# Patient Record
Sex: Female | Born: 1965 | Race: White | Hispanic: No | Marital: Married | Smoking: Never smoker
Health system: Southern US, Community
[De-identification: ages and names within clinical notes are randomized; demographics above are authoritative.]

## PROBLEM LIST (undated history)

## (undated) DIAGNOSIS — J45909 Unspecified asthma, uncomplicated: Secondary | ICD-10-CM

## (undated) DIAGNOSIS — R519 Headache, unspecified: Secondary | ICD-10-CM

## (undated) DIAGNOSIS — R51 Headache: Secondary | ICD-10-CM

## (undated) DIAGNOSIS — R32 Unspecified urinary incontinence: Secondary | ICD-10-CM

## (undated) DIAGNOSIS — T4145XA Adverse effect of unspecified anesthetic, initial encounter: Secondary | ICD-10-CM

## (undated) DIAGNOSIS — F419 Anxiety disorder, unspecified: Secondary | ICD-10-CM

## (undated) DIAGNOSIS — T8859XA Other complications of anesthesia, initial encounter: Secondary | ICD-10-CM

## (undated) HISTORY — PX: LAPAROSCOPIC GASTRIC BANDING: SHX1100

## (undated) HISTORY — DX: Unspecified asthma, uncomplicated: J45.909

## (undated) HISTORY — PX: OTHER SURGICAL HISTORY: SHX169

## (undated) HISTORY — PX: FOOT SURGERY: SHX648

## (undated) HISTORY — PX: LAPAROSCOPIC GASTRIC SLEEVE RESECTION: SHX5895

## (undated) HISTORY — PX: VAGINAL HYSTERECTOMY: SUR661

## (undated) HISTORY — PX: BLADDER SURGERY: SHX569

---

## 2004-08-03 ENCOUNTER — Ambulatory Visit: Payer: Self-pay

## 2004-08-14 ENCOUNTER — Ambulatory Visit: Payer: Self-pay

## 2005-02-12 ENCOUNTER — Ambulatory Visit: Payer: Self-pay

## 2005-08-23 ENCOUNTER — Ambulatory Visit: Payer: Self-pay

## 2006-01-27 ENCOUNTER — Emergency Department: Payer: Self-pay | Admitting: Emergency Medicine

## 2006-03-08 ENCOUNTER — Ambulatory Visit: Payer: Self-pay | Admitting: Unknown Physician Specialty

## 2006-08-29 ENCOUNTER — Ambulatory Visit: Payer: Self-pay

## 2007-07-18 ENCOUNTER — Ambulatory Visit: Payer: Self-pay | Admitting: Nurse Practitioner

## 2007-08-21 ENCOUNTER — Ambulatory Visit: Payer: Self-pay | Admitting: Nurse Practitioner

## 2007-08-25 ENCOUNTER — Ambulatory Visit: Payer: Self-pay | Admitting: Nurse Practitioner

## 2007-08-27 ENCOUNTER — Emergency Department: Payer: Self-pay | Admitting: Unknown Physician Specialty

## 2007-09-02 ENCOUNTER — Ambulatory Visit: Payer: Self-pay | Admitting: Nurse Practitioner

## 2007-09-09 ENCOUNTER — Ambulatory Visit: Payer: Self-pay

## 2008-05-04 ENCOUNTER — Ambulatory Visit: Payer: Self-pay

## 2008-10-14 ENCOUNTER — Emergency Department (HOSPITAL_COMMUNITY): Admission: EM | Admit: 2008-10-14 | Discharge: 2008-10-14 | Payer: Self-pay | Admitting: Emergency Medicine

## 2008-10-26 ENCOUNTER — Ambulatory Visit: Payer: Self-pay | Admitting: Unknown Physician Specialty

## 2008-12-27 ENCOUNTER — Ambulatory Visit: Payer: Self-pay | Admitting: Unknown Physician Specialty

## 2009-03-03 ENCOUNTER — Ambulatory Visit: Payer: Self-pay

## 2009-05-17 ENCOUNTER — Ambulatory Visit: Payer: Self-pay | Admitting: Unknown Physician Specialty

## 2009-10-11 ENCOUNTER — Emergency Department: Payer: Self-pay | Admitting: Emergency Medicine

## 2009-11-07 ENCOUNTER — Ambulatory Visit: Payer: Self-pay | Admitting: Nurse Practitioner

## 2009-11-08 ENCOUNTER — Ambulatory Visit: Payer: Self-pay | Admitting: Family Medicine

## 2009-11-09 ENCOUNTER — Encounter: Admission: RE | Admit: 2009-11-09 | Discharge: 2009-11-09 | Payer: Self-pay | Admitting: Surgery

## 2009-11-11 ENCOUNTER — Encounter: Admission: RE | Admit: 2009-11-11 | Discharge: 2009-11-11 | Payer: Self-pay | Admitting: Surgery

## 2010-04-06 ENCOUNTER — Emergency Department: Payer: Self-pay | Admitting: Emergency Medicine

## 2010-04-06 ENCOUNTER — Ambulatory Visit: Payer: Self-pay | Admitting: Unknown Physician Specialty

## 2010-04-10 ENCOUNTER — Emergency Department: Payer: Self-pay | Admitting: Emergency Medicine

## 2010-04-11 ENCOUNTER — Emergency Department: Payer: Self-pay | Admitting: Emergency Medicine

## 2010-04-19 ENCOUNTER — Ambulatory Visit: Payer: Self-pay | Admitting: Family Medicine

## 2010-05-08 ENCOUNTER — Ambulatory Visit: Payer: Self-pay | Admitting: Nurse Practitioner

## 2010-08-22 ENCOUNTER — Ambulatory Visit: Payer: Self-pay | Admitting: Internal Medicine

## 2010-09-19 ENCOUNTER — Ambulatory Visit: Payer: Self-pay | Admitting: Internal Medicine

## 2010-09-27 LAB — COMPREHENSIVE METABOLIC PANEL
ALT: 13 U/L (ref 0–35)
BUN: 7 mg/dL (ref 6–23)
CO2: 28 mEq/L (ref 19–32)
GFR calc non Af Amer: >60 mL/min (ref >60)
Glucose, Bld: 100 mg/dL — ABNORMAL HIGH (ref 70–99)
Potassium: 3.7 mEq/L (ref 3.5–5.1)
Sodium: 139 mEq/L (ref 135–145)
Total Bilirubin: 0.7 mg/dL (ref 0.3–1.2)
Total Protein: 6.4 g/dL (ref 6.0–8.3)

## 2010-09-27 LAB — DIFFERENTIAL
Basophils Absolute: 0 10*3/uL (ref 0.0–0.1)
Basophils Relative: 1 % (ref 0–1)
Eosinophils Absolute: 0.3 10*3/uL (ref 0.0–0.7)
Eosinophils Relative: 5 % (ref 0–5)
Lymphocytes Relative: 26 % (ref 12–46)
Lymphs Abs: 1.7 10*3/uL (ref 0.7–4.0)
Monocytes Absolute: 0.5 10*3/uL (ref 0.1–1.0)
Neutro Abs: 4 10*3/uL (ref 1.7–7.7)
Neutrophils Relative %: 61 % (ref 43–77)

## 2010-09-27 LAB — CBC
MCV: 90.4 fL (ref 78.0–100.0)
RBC: 4.35 MIL/uL (ref 3.87–5.11)
WBC: 6.6 10*3/uL (ref 4.0–10.5)

## 2010-09-27 LAB — URINALYSIS, ROUTINE W REFLEX MICROSCOPIC
Glucose, UA: NEGATIVE mg/dL
Nitrite: NEGATIVE
Urobilinogen, UA: 1 mg/dL (ref 0.0–1.0)

## 2010-09-27 LAB — URINE MICROSCOPIC-ADD ON

## 2010-09-27 LAB — URINE CULTURE

## 2010-10-31 NOTE — Op Note (Signed)
NAMELINLEY, MOSKAL NO.:  [id number]   MEDICAL RECORD NO.:  [id number]          PATIENT TYPE:  EMS   LOCATION:  ED                           FACILITY:  [facility]   PHYSICIAN:  Adolph Pollack, M.D.DATE OF BIRTH:  [birth year]   DATE OF PROCEDURE:  10/14/2008  DATE OF DISCHARGE:                               OPERATIVE REPORT   PREOPERATIVE DIAGNOSIS:  Epigastric pain and regurgitation in a  laparoscopic band patient.   POSTOPERATIVE DIAGNOSIS:  Epigastric pain and regurgitation in a  laparoscopic band patient.   PROCEDURE:  Laparoscopic band adjustment with withdrawal of 5 mL of  fluid until the band was dry.   SURGEON:  Adolph Pollack, M.D.   TECHNIQUE:  The skin over the port site was prepped with Betadine.  Using a long Huber needle, I was able to place the needle into the port.  I aspirated 5 mL of clear fluid out until it was dry.  I then removed  the needle and discarded the fluid.  A sterile dressing was applied.  She tolerated the procedure well.      Adolph Pollack, M.D.  Electronically Signed     TJR/MEDQ  D:  10/14/2008  T:  10/14/2008  Job:  [id number]

## 2010-10-31 NOTE — Consult Note (Signed)
Erika Shelton, Erika Shelton NO.:  [id number]   MEDICAL RECORD NO.:  [id number]          PATIENT TYPE:  EMS   LOCATION:  ED                           FACILITY:  [facility]   PHYSICIAN:  Adolph Pollack, M.D.DATE OF BIRTH:  [birth year]   DATE OF CONSULTATION:  10/14/2008  DATE OF DISCHARGE:                                 CONSULTATION   REASON FOR CONSULTATION:  Epigastric pain and last laparoscopic band  placement.   HISTORY:  Ms. Erika Shelton is a 44 year old female who underwent a laparoscopic  band procedure for morbid obesity in [place], [place]  approximately 1 year ago.  She has been having some problems with what  she describes as a feeling of anything sheets getting stuck, some  regurgitation, some epigastric pain and some back pain.  She had been  seen in our office for care of her band.  She had some fluid removed by  Dr. Daphine Deutscher about 4 weeks ago but says persistence of her symptoms.  A  primary care physician has put her on a proton pump inhibitor but this  has not worked.  Because the symptoms are persisting she has come to the  emergency department for evaluation.  There has been no noted fever or  chills.  She has had some constipation and decreased urination urinary  frequency.   PHYSICAL EXAMINATION:  CONSTITUTIONAL:  Generally she is an overweight  female who is in no acute distress, pleasant and cooperative.  VITAL SIGNS:  Temperature is 98 degrees, blood pressure is 126/82, pulse  of 82.  ABDOMEN:  Her abdomen is soft.  There is very minimal epigastric  tenderness.  There are active bowel sounds present.  There are small  abdominal incisions and also palpable port in the left abdominal wall.   LABORATORY DATA:  White cell count is normal 6600, hemoglobin 13.4.  Electrolytes within normal limits except for glucose of 100.  Liver  function tests are not elevated and lipase and amylase are normal.  Urinalysis demonstrates many squamous epithelial cells,  7-10 white blood  cells, and many bacteria consistent with contamination.   Abdominal x-rays demonstrated nonobstructive gas pattern, no free air,  the band is at 8:30 to 2:30 position.   IMPRESSION:  1. Proximal gastric obstruction secondary to lap band.  2. Mild dehydration.  3. Contaminated urinalysis specimen.   PLAN:  1. We will remove fluid completely from the lap band (this was done      sterilely and 5 mL was withdrawn).  2. Will hydrate her with 2 liters of lactated Ringer's.  3. Will get a clean catch urinalysis culture.  4. Will have recall the office tomorrow between 9 and 9:30 to let us      know how she is doing.  If she is not doing any better, she may      need to be seen in the office for further workup up.      Adolph Pollack, M.D.  Electronically Signed     TJR/MEDQ  D:  10/14/2008  T:  10/14/2008  Job:  [id number] 

## 2011-04-11 ENCOUNTER — Ambulatory Visit: Payer: Self-pay | Admitting: Unknown Physician Specialty

## 2011-04-16 ENCOUNTER — Ambulatory Visit: Payer: Self-pay | Admitting: Urology

## 2011-05-17 ENCOUNTER — Emergency Department: Payer: Self-pay | Admitting: *Deleted

## 2011-05-31 ENCOUNTER — Ambulatory Visit: Payer: Self-pay | Admitting: Specialist

## 2011-06-19 ENCOUNTER — Ambulatory Visit: Payer: Self-pay | Admitting: Specialist

## 2011-07-04 ENCOUNTER — Ambulatory Visit: Payer: Self-pay | Admitting: Unknown Physician Specialty

## 2012-01-28 ENCOUNTER — Ambulatory Visit: Payer: Self-pay | Admitting: Unknown Physician Specialty

## 2012-08-26 ENCOUNTER — Ambulatory Visit: Payer: Self-pay | Admitting: Urology

## 2013-01-29 ENCOUNTER — Ambulatory Visit: Payer: Self-pay | Admitting: Family

## 2013-02-02 ENCOUNTER — Ambulatory Visit: Payer: Self-pay | Admitting: Family

## 2013-02-18 ENCOUNTER — Ambulatory Visit: Payer: Self-pay | Admitting: Family

## 2013-04-02 ENCOUNTER — Other Ambulatory Visit: Payer: Self-pay | Admitting: Podiatry

## 2013-04-02 MED ORDER — CEPHALEXIN 500 MG PO CAPS: 500.0000 mg | ORAL_CAPSULE | Freq: Three times a day (TID) | ORAL | Status: DC

## 2013-04-02 MED ORDER — OXYCODONE-ACETAMINOPHEN 10-325 MG PO TABS: ORAL_TABLET | ORAL | Status: DC

## 2013-04-02 MED ORDER — PROMETHAZINE HCL 25 MG PO TABS: 25.0000 mg | ORAL_TABLET | Freq: Three times a day (TID) | ORAL | Status: DC | PRN

## 2013-04-03 ENCOUNTER — Encounter: Payer: Self-pay | Admitting: Podiatry

## 2013-04-03 DIAGNOSIS — M21619 Bunion of unspecified foot: Secondary | ICD-10-CM

## 2013-04-03 DIAGNOSIS — M2040 Other hammer toe(s) (acquired), unspecified foot: Secondary | ICD-10-CM

## 2013-04-06 ENCOUNTER — Telehealth: Payer: Self-pay | Admitting: *Deleted

## 2013-04-06 MED ORDER — MEPERIDINE HCL 50 MG PO TABS: ORAL_TABLET | ORAL | Status: DC

## 2013-04-06 NOTE — Telephone Encounter (Signed)
I have changed to Demerol.  Please have someone to come in and pick it up.

## 2013-04-06 NOTE — Telephone Encounter (Signed)
Dr Al Corpus did surgery on Friday , he prescribed hydrocodone for me , pt is itching real bad with the hydrocodone, she took a benadry with it and still itching, is there another pain medication that she can have?

## 2013-04-08 ENCOUNTER — Ambulatory Visit (INDEPENDENT_AMBULATORY_CARE_PROVIDER_SITE_OTHER): Payer: 59 | Admitting: Podiatry

## 2013-04-08 ENCOUNTER — Ambulatory Visit (INDEPENDENT_AMBULATORY_CARE_PROVIDER_SITE_OTHER): Payer: 59

## 2013-04-08 ENCOUNTER — Encounter: Payer: Self-pay | Admitting: Podiatry

## 2013-04-08 VITALS — BP 123/78 | HR 97 | Resp 16 | Ht 63.0 in | Wt 206.0 lb

## 2013-04-08 DIAGNOSIS — M2042 Other hammer toe(s) (acquired), left foot: Secondary | ICD-10-CM

## 2013-04-08 DIAGNOSIS — M2040 Other hammer toe(s) (acquired), unspecified foot: Secondary | ICD-10-CM

## 2013-04-08 DIAGNOSIS — M2041 Other hammer toe(s) (acquired), right foot: Secondary | ICD-10-CM

## 2013-04-08 NOTE — Patient Instructions (Signed)
REMEMBER TO KEEP YOUR FEET ELEVATED AND STAY OFF OF THEM.

## 2013-04-08 NOTE — Progress Notes (Signed)
Ms. Willa Rough presents today just short of 1 week status post hammertoe refit pair fifth bilateral and fifth metatarsal osteotomy bilateral. She states they've been quite sore she has been working been up on her feet. Denies fever chills nausea vomiting muscle aches or pains. States that the oxycodone that I gave her made her itch. Did switch that to Demerol and she just picking a prescription of today.  Objective: Vital signs are stable she is alert and oriented x3. Pulses remain palpable bilateral. Dry sterile dressing was removed demonstrates sutures are intact margins are well coapted. Considerable amount of edema with no erythema. Graphic evaluation does demonstrate fifth metatarsal osteotomies and arthroplasties fifth toes bilateral.  Assessment: Well-healing surgical feet. Fifth metatarsal and hammertoe repairs bilateral.  Plan: Redressed today with a dressed a compressive dressing encouraged her to stay off of her feet. She's to keep her feet elevated as often as possible. Wrote her note to be out of work. Followup with her in one week hopefully for suture removal.

## 2013-04-16 ENCOUNTER — Ambulatory Visit: Payer: 59 | Admitting: Podiatry

## 2013-04-16 ENCOUNTER — Encounter: Payer: Self-pay | Admitting: Podiatry

## 2013-04-16 ENCOUNTER — Ambulatory Visit (INDEPENDENT_AMBULATORY_CARE_PROVIDER_SITE_OTHER): Payer: 59 | Admitting: Podiatry

## 2013-04-16 VITALS — BP 115/68 | HR 87 | Temp 96.5°F | Resp 16 | Ht 63.0 in | Wt 206.0 lb

## 2013-04-16 DIAGNOSIS — B351 Tinea unguium: Secondary | ICD-10-CM

## 2013-04-16 DIAGNOSIS — Z9889 Other specified postprocedural states: Secondary | ICD-10-CM

## 2013-04-16 NOTE — Progress Notes (Signed)
Erika Shelton presents today for her two-week postop visit regarding her fifth metatarsal osteotomies fifth bilateral. She states her hammertoes are doing pretty well.  Objective: Vital signs are stable she is alert and oriented x3. There is mild edema no erythema cellulitis drainage or odor to surgical sites. Margins appear to be well coapted sutures are in place.  Assessment: Well-healing surgical foot bilateral.  Plan: Sutures were removed today compression dressings were applied. She was me to allow her to get out of her Darco shoes. I declined. She's continue to wear the Darco shoes for at least another 2 weeks and radiographs will be taken at that point in time.

## 2013-04-29 ENCOUNTER — Ambulatory Visit (INDEPENDENT_AMBULATORY_CARE_PROVIDER_SITE_OTHER): Payer: 59

## 2013-04-29 ENCOUNTER — Encounter: Payer: Self-pay | Admitting: Podiatry

## 2013-04-29 ENCOUNTER — Ambulatory Visit (INDEPENDENT_AMBULATORY_CARE_PROVIDER_SITE_OTHER): Payer: 59 | Admitting: Podiatry

## 2013-04-29 VITALS — BP 113/71 | HR 70 | Resp 16 | Ht 63.0 in | Wt 206.0 lb

## 2013-04-29 DIAGNOSIS — Z9889 Other specified postprocedural states: Secondary | ICD-10-CM

## 2013-04-29 NOTE — Progress Notes (Signed)
Ms. Erika Shelton presents today date of surgery 04/03/2013 for followup of a fifth metatarsal osteotomy bilateral fifth hammertoe repair she states that they're doing quite well still sore swollen tender to put in shoes.  Objective evaluation: Pulses are palpable bilateral she has no tenderness on palpation of the fifth metatarsal and fifth toes bilateral. Mild edema is noted bilateral fifth toes. Radiographic evaluation demonstrates well-healing surgical foot bilateral.  Assessment: Well-healing surgical foot one month out fifth metatarsal bilateral. Hammertoes fifth bilateral.  Plan: We'll or tobacco during her shoe gear and we did to continue to wrap the fifth toes with Caban. Ultram one month for another set of x-rays.

## 2013-05-27 ENCOUNTER — Encounter: Payer: 59 | Admitting: Podiatry

## 2013-06-03 ENCOUNTER — Encounter: Payer: Self-pay | Admitting: Podiatry

## 2013-06-24 ENCOUNTER — Ambulatory Visit (INDEPENDENT_AMBULATORY_CARE_PROVIDER_SITE_OTHER): Payer: 59 | Admitting: Podiatry

## 2013-06-24 ENCOUNTER — Ambulatory Visit (INDEPENDENT_AMBULATORY_CARE_PROVIDER_SITE_OTHER): Payer: 59

## 2013-06-24 ENCOUNTER — Encounter: Payer: Self-pay | Admitting: Podiatry

## 2013-06-24 VITALS — BP 132/76 | HR 78 | Resp 16

## 2013-06-24 DIAGNOSIS — Z9889 Other specified postprocedural states: Secondary | ICD-10-CM

## 2013-06-24 NOTE — Progress Notes (Signed)
   Subjective:    Patient ID: Erika Shelton, female    DOB: [birth year], 48 y.o.   MRN: [id number]  HPI Comments: Post op 10.17.14  Follow up fifth met ost bilateral , sometimes in the right foot i feel burning sensation  Left foot is good       Review of Systems     Objective:   Physical Exam: Vital signs are stable she is alert and oriented x3. Pulses are strongly palpable bilateral. Fifth metatarsal osteotomies have gone to heal 100% per radiograph. She has no pain on palpation or ambulation. Radiographic evaluation does demonstrate complete osseus healing about fifth metatarsals with retention of the fifth metatarsal screws.        Assessment & Plan:  Assessment: Some residual plantar fasciitis right. Well-healing surgical fifth metatarsals and toes.  Plan: I will followup with her on an as-needed basis.

## 2013-06-26 ENCOUNTER — Ambulatory Visit: Payer: Self-pay | Admitting: Obstetrics and Gynecology

## 2013-06-29 NOTE — Progress Notes (Signed)
1. 5TH METATARSAL OSTEOTOMY WITH SCREWS BOTH FEET  2. HAMMER TOE REPAIR 5TH TOE BOTH FEET

## 2013-12-03 ENCOUNTER — Ambulatory Visit: Payer: 59 | Admitting: Podiatry

## 2013-12-23 ENCOUNTER — Encounter: Payer: Self-pay | Admitting: Podiatry

## 2013-12-23 ENCOUNTER — Ambulatory Visit (INDEPENDENT_AMBULATORY_CARE_PROVIDER_SITE_OTHER): Payer: 59 | Admitting: Podiatry

## 2013-12-23 VITALS — BP 104/52 | HR 98 | Resp 16

## 2013-12-23 DIAGNOSIS — M722 Plantar fascial fibromatosis: Secondary | ICD-10-CM

## 2013-12-23 NOTE — Progress Notes (Signed)
She presents today for chief complaint of painful plantar fasciitis right heel.  Objective: Pulses are palpable right foot. She's been on palpation medial continued tubercle of the right heel.  Assessment: Pain in limb secondary to plantar fasciitis right foot.  Plan: Reinjected the right heel today with Kenalog and local anesthetic. She will followup with [place] as needed.

## 2013-12-28 ENCOUNTER — Other Ambulatory Visit: Payer: Self-pay | Admitting: *Deleted

## 2013-12-28 ENCOUNTER — Telehealth: Payer: Self-pay | Admitting: *Deleted

## 2013-12-28 MED ORDER — MELOXICAM 15 MG PO TABS: 15.0000 mg | ORAL_TABLET | Freq: Every day | ORAL | Status: DC

## 2013-12-28 NOTE — Telephone Encounter (Signed)
Pt called stating that she had an injection in her heel a week or so ago and still having some soreness. Asked if she should be taking a anti inflammatory? Per dr Milinda Pointer to call in mobic

## 2014-01-05 ENCOUNTER — Emergency Department: Payer: Self-pay | Admitting: Internal Medicine

## 2014-01-05 LAB — BASIC METABOLIC PANEL
Anion Gap: 5 — ABNORMAL LOW (ref 7–16)
BUN: 11 mg/dL (ref 7–18)
Calcium, Total: 8.7 mg/dL (ref 8.5–10.1)
Chloride: 105 mmol/L (ref 98–107)
Co2: 29 mmol/L (ref 21–32)
Creatinine: 0.85 mg/dL (ref 0.60–1.30)
EGFR (African American): >60
EGFR (Non-African Amer.): >60
Glucose: 85 mg/dL (ref 65–99)
Osmolality: 276 (ref 275–301)
Potassium: 3.8 mmol/L (ref 3.5–5.1)
Sodium: 139 mmol/L (ref 136–145)

## 2014-01-05 LAB — CBC
HCT: 40.2 % (ref 35.0–47.0)
HGB: 13 g/dL (ref 12.0–16.0)
MCH: 28.4 pg (ref 26.0–34.0)
MCHC: 32.3 g/dL (ref 32.0–36.0)
MCV: 88 fL (ref 80–100)
Platelet: 281 10*3/uL (ref 150–440)
RBC: 4.57 10*6/uL (ref 3.80–5.20)
RDW: 13.7 % (ref 11.5–14.5)
WBC: 6.9 10*3/uL (ref 3.6–11.0)

## 2014-01-05 LAB — TROPONIN I: Troponin-I: <0.02 ng/mL

## 2014-01-05 LAB — TSH: Thyroid Stimulating Horm: 3.06 u[IU]/mL

## 2014-01-05 LAB — PRO B NATRIURETIC PEPTIDE: B-Type Natriuretic Peptide: 43 pg/mL (ref 0–125)

## 2014-01-05 LAB — D-DIMER(ARMC): D-Dimer: 319 ng/ml

## 2014-01-22 DIAGNOSIS — R42 Dizziness and giddiness: Secondary | ICD-10-CM | POA: Insufficient documentation

## 2014-01-22 DIAGNOSIS — R0602 Shortness of breath: Secondary | ICD-10-CM | POA: Insufficient documentation

## 2014-01-22 DIAGNOSIS — R000 Tachycardia, unspecified: Secondary | ICD-10-CM | POA: Insufficient documentation

## 2014-01-22 DIAGNOSIS — R079 Chest pain, unspecified: Secondary | ICD-10-CM | POA: Insufficient documentation

## 2014-06-28 ENCOUNTER — Encounter: Payer: Self-pay | Admitting: Podiatry

## 2014-06-29 ENCOUNTER — Ambulatory Visit: Payer: Self-pay | Admitting: Obstetrics and Gynecology

## 2014-07-01 ENCOUNTER — Ambulatory Visit: Payer: Self-pay | Admitting: Obstetrics and Gynecology

## 2014-07-27 ENCOUNTER — Ambulatory Visit: Payer: Self-pay | Admitting: Specialist

## 2014-07-29 ENCOUNTER — Ambulatory Visit: Payer: Self-pay | Admitting: Specialist

## 2014-07-29 DIAGNOSIS — G4733 Obstructive sleep apnea (adult) (pediatric): Secondary | ICD-10-CM | POA: Insufficient documentation

## 2014-07-29 DIAGNOSIS — E782 Mixed hyperlipidemia: Secondary | ICD-10-CM | POA: Insufficient documentation

## 2014-08-17 ENCOUNTER — Ambulatory Visit
Admit: 2014-08-17 | Disposition: A | Discharge status: Home / Self Care | Payer: Self-pay | Attending: Specialist | Admitting: Specialist

## 2014-09-17 ENCOUNTER — Ambulatory Visit
Admit: 2014-09-17 | Disposition: A | Discharge status: Home / Self Care | Payer: Self-pay | Attending: Specialist | Admitting: Specialist

## 2014-09-29 ENCOUNTER — Ambulatory Visit
Admit: 2014-09-29 | Disposition: A | Discharge status: Home / Self Care | Payer: Self-pay | Attending: General Practice | Admitting: General Practice

## 2014-10-06 ENCOUNTER — Ambulatory Visit
Admit: 2014-10-06 | Disposition: A | Discharge status: Home / Self Care | Payer: Self-pay | Attending: Pain Medicine | Admitting: Pain Medicine

## 2014-10-08 NOTE — Op Note (Signed)
PATIENT NAME:  Erika Shelton, Erika Shelton MR#:  [id number] DATE OF BIRTH:  [birth year]  DATE OF PROCEDURE:  08/26/2012  PREOPERATIVE DIAGNOSES:  1.  Urinary frequency.  2.  Urge incontinence.   POSTOPERATIVE DIAGNOSES:  1.  Urinary frequency.  2.  Urge incontinence.   PROCEDURE: InterStim placement.   SURGEON: Edrick Oh, MD.   ANESTHESIA: General endotracheal anesthesia.   INDICATIONS: The patient is a 49 year old white female with a long history of urinary frequency, urgency and urge incontinence. She has been tried on medical management without success. She underwent recent InterStim trial with significant benefit. She presents for InterStim placement.   DESCRIPTION OF PROCEDURE: After informed consent was obtained, the patient was taken to the operating room and placed in the prone position on the operating table under general endotracheal anesthesia. The patient was then prepped and draped in the usual standard fashion. A needle was placed after marking the skin of appropriate areas for the third sacral foramen. The needle was advanced into a foramen without difficulty. On fluoroscopy, it appeared to be the S4 foramen. No response was obtained. The needle was repositioned more superior. It was easily advanced into the third sacral foramen. Stimulation demonstrated good toe response and bellows with no leg rotation. Due to the good response, the decision was made not to proceed with any additional needle placement on the left. The obturator was placed through the needle. The needle was then removed. A small skin incision was made. The sheath was advanced over the obturator. The sheath was advanced to just below the lower level of the sacrum. The obturator of the sheath was then removed. A lead was advanced through the sheath. The sheath was withdrawn to the first position. Testing demonstrated good response on 0 and 1 with minimal response on 2. The lead was slightly repositioned with gentle manipulation.  This demonstrated good response on 1 and 2 with mild response on 0 and slight response on 3. The sheath was then completely withdrawn. Testing of the lead once again demonstrated the same response. A subcutaneous pocket was then made in the upper outer buttock on the right. Once the pocket was developed, it was irrigated with ample amounts of antibiotic irrigating solution. The subcutaneous pass was then placed from the lead site to the pocket site. The lead was then tunneled underneath the skin. The generator was connected to the lead. It was placed into the subcutaneous pocket. An impedance test was performed demonstrating good parameters throughout. The pocket was then closed in 2 layers utilizing a 3-0 Vicryl suture. The skin was then closed utilizing a running 4-0 Vicryl subcuticular stitch. The lead site was also closed utilizing a 4-0 Vicryl subcuticular stitch. Steri-Strips, Telfa and Tegaderm dressing was then applied. The patient was returned to the supine position and awakened from general endotracheal anesthesia. She was taken to the recovery room in stable condition. There were no problems or complications. The patient tolerated the procedure well.  ____________________________ Denice Bors. Jacqlyn Larsen, MD bsc:aw D: 08/27/2012 08:00:10 ET T: 08/27/2012 08:26:36 ET JOB#: [id number]  cc: Denice Bors. Jacqlyn Larsen, MD, <Dictator> Denice Bors Ajanay Farve MD ELECTRONICALLY SIGNED 09/01/2012 12:06

## 2014-10-26 ENCOUNTER — Ambulatory Visit: Payer: Self-pay | Admitting: Pain Medicine

## 2014-11-21 ENCOUNTER — Other Ambulatory Visit: Payer: Self-pay | Admitting: Pain Medicine

## 2014-11-21 DIAGNOSIS — S93699A Other sprain of unspecified foot, initial encounter: Secondary | ICD-10-CM | POA: Insufficient documentation

## 2014-11-21 DIAGNOSIS — G90521 Complex regional pain syndrome I of right lower limb: Secondary | ICD-10-CM

## 2014-11-21 DIAGNOSIS — G90529 Complex regional pain syndrome I of unspecified lower limb: Secondary | ICD-10-CM | POA: Insufficient documentation

## 2014-11-23 ENCOUNTER — Encounter: Payer: Self-pay | Admitting: Pain Medicine

## 2014-11-23 ENCOUNTER — Ambulatory Visit: Payer: 59 | Attending: Pain Medicine | Admitting: Pain Medicine

## 2014-11-23 VITALS — BP 143/92 | HR 74 | Temp 98.0°F | Resp 16 | Ht 63.0 in | Wt 215.0 lb

## 2014-11-23 DIAGNOSIS — M47816 Spondylosis without myelopathy or radiculopathy, lumbar region: Secondary | ICD-10-CM | POA: Insufficient documentation

## 2014-11-23 DIAGNOSIS — M5126 Other intervertebral disc displacement, lumbar region: Secondary | ICD-10-CM | POA: Diagnosis not present

## 2014-11-23 DIAGNOSIS — M79604 Pain in right leg: Secondary | ICD-10-CM | POA: Diagnosis present

## 2014-11-23 DIAGNOSIS — M533 Sacrococcygeal disorders, not elsewhere classified: Secondary | ICD-10-CM | POA: Insufficient documentation

## 2014-11-23 DIAGNOSIS — M5136 Other intervertebral disc degeneration, lumbar region: Secondary | ICD-10-CM

## 2014-11-23 DIAGNOSIS — M5416 Radiculopathy, lumbar region: Secondary | ICD-10-CM

## 2014-11-23 DIAGNOSIS — M79605 Pain in left leg: Secondary | ICD-10-CM | POA: Diagnosis present

## 2014-11-23 DIAGNOSIS — M51369 Other intervertebral disc degeneration, lumbar region without mention of lumbar back pain or lower extremity pain: Secondary | ICD-10-CM | POA: Insufficient documentation

## 2014-11-23 DIAGNOSIS — M545 Low back pain: Secondary | ICD-10-CM | POA: Diagnosis present

## 2014-11-23 MED ORDER — MIDAZOLAM HCL 5 MG/5ML IJ SOLN
INTRAMUSCULAR | Status: AC
  Administered 2014-11-23: 3 mg via INTRAVENOUS
  Filled 2014-11-23: qty 5

## 2014-11-23 MED ORDER — LIDOCAINE HCL (PF) 1 % IJ SOLN
INTRAMUSCULAR | Status: AC
Start: 2014-11-23 — End: 2014-11-23
  Administered 2014-11-23: 5 mL
  Filled 2014-11-23: qty 5

## 2014-11-23 MED ORDER — SODIUM CHLORIDE 0.9 % IJ SOLN
INTRAMUSCULAR | Status: AC
  Administered 2014-11-23: 14:00:00
  Filled 2014-11-23: qty 20

## 2014-11-23 MED ORDER — BUPIVACAINE HCL (PF) 0.25 % IJ SOLN
INTRAMUSCULAR | Status: AC
  Administered 2014-11-23: 15 mL
  Filled 2014-11-23: qty 30

## 2014-11-23 MED ORDER — CEFUROXIME AXETIL 250 MG PO TABS: 250.0000 mg | ORAL_TABLET | Freq: Two times a day (BID) | ORAL | Status: DC

## 2014-11-23 MED ORDER — FENTANYL CITRATE (PF) 100 MCG/2ML IJ SOLN
INTRAMUSCULAR | Status: AC
  Administered 2014-11-23: 50 ug via INTRAVENOUS
  Filled 2014-11-23: qty 2

## 2014-11-23 MED ORDER — ORPHENADRINE CITRATE 30 MG/ML IJ SOLN
INTRAMUSCULAR | Status: AC
  Filled 2014-11-23: qty 2

## 2014-11-23 MED ORDER — TRIAMCINOLONE ACETONIDE 40 MG/ML IJ SUSP
INTRAMUSCULAR | Status: AC
  Administered 2014-11-23: 40 mg
  Filled 2014-11-23: qty 1

## 2014-11-23 MED ORDER — ZOLPIDEM TARTRATE 10 MG PO TABS: 10.0000 mg | ORAL_TABLET | Freq: Every evening | ORAL | Status: DC | PRN

## 2014-11-23 MED ORDER — KETOROLAC TROMETHAMINE 60 MG/2ML IM SOLN
INTRAMUSCULAR | Status: AC
  Administered 2014-11-23: 60 mg via INTRAMUSCULAR
  Filled 2014-11-23: qty 2

## 2014-11-23 MED ORDER — BUPIVACAINE HCL (PF) 0.25 % IJ SOLN
INTRAMUSCULAR | Status: AC
  Filled 2014-11-23: qty 30

## 2014-11-23 MED ORDER — CEFAZOLIN SODIUM 1 G IJ SOLR
INTRAMUSCULAR | Status: AC
  Administered 2014-11-23: 1 g via INTRAVENOUS
  Filled 2014-11-23: qty 10

## 2014-11-23 MED ORDER — FLUCONAZOLE 150 MG PO TABS: ORAL_TABLET | ORAL | Status: DC

## 2014-11-23 NOTE — Progress Notes (Signed)
Safety precautions to be maintained throughout the outpatient stay will include: orient to surroundings, keep bed in low position, maintain call bell within reach at all times, provide assistance with transfer out of bed and ambulation. Discharged to home with mother in law with script in hand for diflucan, ceftin, ambien. Post procedure instructions given wioth teach back 3 done.

## 2014-11-23 NOTE — Progress Notes (Signed)
   Subjective:    Patient ID: Juanita Craver, female    DOB: [birth year], 49 y.o.   MRN: [id number]  HPI  PROCEDURE PERFORMED: Lumbar epidural steroid injection   NOTE: The patient is a 49 y.o. female who returns to [facility] for further evaluation and treatment of pain involving the lumbar and lower extremity region. MRI revealed the patient to be with L2 bulge greater on the left without nerve root compression L3-4 bulge, L4-L5 facet degenerative changes, L4-5 facet joint degeneration with bulge slightly greater to the left L5-S1 facet joint degenerative changes with bulge with contact upper S1 nerve roots, sacroiliac joint degenerative changes. The risks, benefits, and expectations of the procedure have been discussed and explained to the patient who was understanding and in agreement with suggested treatment plan. We will proceed with interventional treatment as discussed and explained to the patient who is willing to proceed with procedure as planned.   DESCRIPTION OF PROCEDURE: Lumbar epidural steroid injection with IV Versed, IV fentanyl conscious sedation, EKG, blood pressure, pulse, and pulse oximetry monitoring. The procedure was performed with the patient in the prone position under fluoroscopic guidance. A local anesthetic skin wheal of 1.5% plain lidocaine was accomplished at proposed entry site. An 18-gauge Tuohy epidural needle was inserted at the L 4 vertebral body level right of the midline via loss-of-resistance technique with negative heme and negative CSF return. A total of 4 mL of Preservative-Free normal saline with 40 mg of Kenalog injected incrementally via epidurally placed needle. Needle removed. The patient tolerated the injection well.   Myoneural block injections cervical region A total of 5 cc of quarter percent bupivacaine with tramadol 60 mg was injected following negative aspiration for myoneural block injection of the cervical region. Patient tolerated  procedure well  Myoneural block injection of the gluteal region  A total of 10 cc of quarter percent bupivacaine with Norflex was injected in the gluteal musculature region following negative aspiration for myoneural block injection of the gluteal musculature region performed 2. Patient tolerated procedure well    PLAN:   1. Medications: We will continue presently prescribed medications. 2. Will consider modification of treatment regimen pending response to treatment rendered on today's visit and follow-up evaluation. 3. The patient is to follow-up with primary care physician regarding blood pressure and general medical condition status post lumbar epidural steroid injection performed on today's visit. 4. Surgical evaluation. 5. Neurological evaluation. 6. The patient may be a candidate for radiofrequency procedures, implantation device, and other treatment pending response to treatment and follow-up evaluation. 7. The patient has been advised to adhere to proper body mechanics and avoid activities which appear to aggravate condition. 8. The patient has been advised to call the [facility] prior to scheduled return appointment should there be significant change in condition or should there be significant  Review of Systems     Objective:   Physical Exam        Assessment & Plan:

## 2014-11-23 NOTE — Patient Instructions (Addendum)
Continue present medications and antibiotic Ceftin, Diflucan, and Ambien  F/U PCP for evaliation of  BP and general medical  condition.  F/U surgical evaluation.  F/U nrurological evaluation.  May consider radiofrequency rhizolysis or intraspinal procedures pending response to present treatment and F/U evaluation.  Patient to call [facility] should patient have concerns prior to scheduled return appointment.  Pain Management Discharge Instructions  General Discharge Instructions :  If you need to reach your doctor call: Monday-Friday 8:00 am - 4:00 pm at [phone] or toll free [phone].  After clinic hours [phone] to have operator reach doctor.  Bring all of your medication bottles to all your appointments in the pain clinic.  To cancel or reschedule your appointment with Pain Management please remember to call 24 hours in advance to avoid a fee.  Refer to the educational materials which you have been given on: General Risks, I had my Procedure. Discharge Instructions, Post Sedation.  Post Procedure Instructions:  The drugs you were given will stay in your system until tomorrow, so for the next 24 hours you should not drive, make any legal decisions or drink any alcoholic beverages.  You may eat anything you prefer, but it is better to start with liquids then soups and crackers, and gradually work up to solid foods.  Please notify your doctor immediately if you have any unusual bleeding, trouble breathing or pain that is not related to your normal pain.  Depending on the type of procedure that was done, some parts of your body may feel week and/or numb.  This usually clears up by tonight or the next day.  Walk with the use of an assistive device or accompanied by an adult for the 24 hours.  You may use ice on the affected area for the first 24 hours.  Put ice in a Ziploc bag and cover with a towel and place against area 15 minutes on 15 minutes off.  You  may switch to heat after 24 hours.GENERAL RISKS AND COMPLICATIONS  What are the risk, side effects and possible complications? Generally speaking, most procedures are safe.  However, with any procedure there are risks, side effects, and the possibility of complications.  The risks and complications are dependent upon the sites that are lesioned, or the type of nerve block to be performed.  The closer the procedure is to the spine, the more serious the risks are.  Great care is taken when placing the radio frequency needles, block needles or lesioning probes, but sometimes complications can occur. 1. Infection: Any time there is an injection through the skin, there is a risk of infection.  This is why sterile conditions are used for these blocks.  There are four possible types of infection. 1. Localized skin infection. 2. Central Nervous System Infection-This can be in the form of Meningitis, which can be deadly. 3. Epidural Infections-This can be in the form of an epidural abscess, which can cause pressure inside of the spine, causing compression of the spinal cord with subsequent paralysis. This would require an emergency surgery to decompress, and there are no guarantees that the patient would recover from the paralysis. 4. Discitis-This is an infection of the intervertebral discs.  It occurs in about 1% of discography procedures.  It is difficult to treat and it may lead to surgery.        2. Pain: the needles have to go through skin and soft tissues, will cause soreness.       3.  Damage to internal structures:  The nerves to be lesioned may be near blood vessels or    other nerves which can be potentially damaged.       4. Bleeding: Bleeding is more common if the patient is taking blood thinners such as  aspirin, Coumadin, Ticiid, Plavix, etc., or if he/she have some genetic predisposition  such as hemophilia. Bleeding into the spinal canal can cause compression of the spinal  cord with subsequent  paralysis.  This would require an emergency surgery to  decompress and there are no guarantees that the patient would recover from the  paralysis.       5. Pneumothorax:  Puncturing of a lung is a possibility, every time a needle is introduced in  the area of the chest or upper back.  Pneumothorax refers to free air around the  collapsed lung(s), inside of the thoracic cavity (chest cavity).  Another two possible  complications related to a similar event would include: Hemothorax and Chylothorax.   These are variations of the Pneumothorax, where instead of air around the collapsed  lung(s), you may have blood or chyle, respectively.       6. Spinal headaches: They may occur with any procedures in the area of the spine.       7. Persistent CSF (Cerebro-Spinal Fluid) leakage: This is a rare problem, but may occur  with prolonged intrathecal or epidural catheters either due to the formation of a fistulous  track or a dural tear.       8. Nerve damage: By working so close to the spinal cord, there is always a possibility of  nerve damage, which could be as serious as a permanent spinal cord injury with  paralysis.       9. Death:  Although rare, severe deadly allergic reactions known as "Anaphylactic  reaction" can occur to any of the medications used.      10. Worsening of the symptoms:  We can always make thing worse.  What are the chances of something like this happening? Chances of any of this occuring are extremely low.  By statistics, you have more of a chance of getting killed in a motor vehicle accident: while driving to the hospital than any of the above occurring .  Nevertheless, you should be aware that they are possibilities.  In general, it is similar to taking a shower.  Everybody knows that you can slip, hit your head and get killed.  Does that mean that you should not shower again?  Nevertheless always keep in mind that statistics do not mean anything if you happen to be on the wrong side of  them.  Even if a procedure has a 1 (one) in a 1,000,000 (million) chance of going wrong, it you happen to be that one..Also, keep in mind that by statistics, you have more of a chance of having something go wrong when taking medications.  Who should not have this procedure? If you are on a blood thinning medication (e.g. Coumadin, Plavix, see list of "Blood Thinners"), or if you have an active infection going on, you should not have the procedure.  If you are taking any blood thinners, please inform your physician.  How should I prepare for this procedure?  Do not eat or drink anything at least six hours prior to the procedure.  Bring a driver with you .  It cannot be a taxi.  Come accompanied by an adult that can drive you back, and that  is strong enough to help you if your legs get weak or numb from the local anesthetic.  Take all of your medicines the morning of the procedure with just enough water to swallow them.  If you have diabetes, make sure that you are scheduled to have your procedure done first thing in the morning, whenever possible.  If you have diabetes, take only half of your insulin dose and notify our nurse that you have done so as soon as you arrive at the clinic.  If you are diabetic, but only take blood sugar pills (oral hypoglycemic), then do not take them on the morning of your procedure.  You may take them after you have had the procedure.  Do not take aspirin or any aspirin-containing medications, at least eleven (11) days prior to the procedure.  They may prolong bleeding.  Wear loose fitting clothing that may be easy to take off and that you would not mind if it got stained with Betadine or blood.  Do not wear any jewelry or perfume  Remove any nail coloring.  It will interfere with some of our monitoring equipment.  NOTE: Remember that this is not meant to be interpreted as a complete list of all possible complications.  Unforeseen problems may occur.  BLOOD  THINNERS The following drugs contain aspirin or other products, which can cause increased bleeding during surgery and should not be taken for 2 weeks prior to and 1 week after surgery.  If you should need take something for relief of minor pain, you may take acetaminophen which is found in Tylenol,m Datril, Anacin-3 and Panadol. It is not blood thinner. The products listed below are.  Do not take any of the products listed below in addition to any listed on your instruction sheet.  A.P.C or A.P.C with Codeine Codeine Phosphate Capsules #3 Ibuprofen Ridaura  ABC compound Congesprin Imuran rimadil  Advil Cope Indocin Robaxisal  Alka-Seltzer Effervescent Pain Reliever and Antacid Coricidin or Coricidin-D  Indomethacin Rufen  Alka-Seltzer plus Cold Medicine Cosprin Ketoprofen S-A-C Tablets  Anacin Analgesic Tablets or Capsules Coumadin Korlgesic Salflex  Anacin Extra Strength Analgesic tablets or capsules CP-2 Tablets Lanoril Salicylate  Anaprox Cuprimine Capsules Levenox Salocol  Anexsia-D Dalteparin Magan Salsalate  Anodynos Darvon compound Magnesium Salicylate Sine-off  Ansaid Dasin Capsules Magsal Sodium Salicylate  Anturane Depen Capsules Marnal Soma  APF Arthritis pain formula Dewitt's Pills Measurin Stanback  Argesic Dia-Gesic Meclofenamic Sulfinpyrazone  Arthritis Bayer Timed Release Aspirin Diclofenac Meclomen Sulindac  Arthritis pain formula Anacin Dicumarol Medipren Supac  Analgesic (Safety coated) Arthralgen Diffunasal Mefanamic Suprofen  Arthritis Strength Bufferin Dihydrocodeine Mepro Compound Suprol  Arthropan liquid Dopirydamole Methcarbomol with Aspirin Synalgos  ASA tablets/Enseals Disalcid Micrainin Tagament  Ascriptin Doan's Midol Talwin  Ascriptin A/D Dolene Mobidin Tanderil  Ascriptin Extra Strength Dolobid Moblgesic Ticlid  Ascriptin with Codeine Doloprin or Doloprin with Codeine Momentum Tolectin  Asperbuf Duoprin Mono-gesic Trendar  Aspergum Duradyne Motrin or Motrin  IB Triminicin  Aspirin plain, buffered or enteric coated Durasal Myochrisine Trigesic  Aspirin Suppositories Easprin Nalfon Trillsate  Aspirin with Codeine Ecotrin Regular or Extra Strength Naprosyn Uracel  Atromid-S Efficin Naproxen Ursinus  Auranofin Capsules Elmiron Neocylate Vanquish  Axotal Emagrin Norgesic Verin  Azathioprine Empirin or Empirin with Codeine Normiflo Vitamin E  Azolid Emprazil Nuprin Voltaren  Bayer Aspirin plain, buffered or children's or timed BC Tablets or powders Encaprin Orgaran Warfarin Sodium  Buff-a-Comp Enoxaparin Orudis Zorpin  Buff-a-Comp with Codeine Equegesic Os-Cal-Gesic   Buffaprin Excedrin plain, buffered or  Extra Strength Oxalid   Bufferin Arthritis Strength Feldene Oxphenbutazone   Bufferin plain or Extra Strength Feldene Capsules Oxycodone with Aspirin   Bufferin with Codeine Fenoprofen Fenoprofen Pabalate or Pabalate-SF   Buffets II Flogesic Panagesic   Buffinol plain or Extra Strength Florinal or Florinal with Codeine Panwarfarin   Buf-Tabs Flurbiprofen Penicillamine   Butalbital Compound Four-way cold tablets Penicillin   Butazolidin Fragmin Pepto-Bismol   Carbenicillin Geminisyn Percodan   Carna Arthritis Reliever Geopen Persantine   Carprofen Gold's salt Persistin   Chloramphenicol Goody's Phenylbutazone   Chloromycetin Haltrain Piroxlcam   Clmetidine heparin Plaquenil   Cllnoril Hyco-pap Ponstel   Clofibrate Hydroxy chloroquine Propoxyphen         Before stopping any of these medications, be sure to consult the physician who ordered them.  Some, such as Coumadin (Warfarin) are ordered to prevent or treat serious conditions such as "deep thrombosis", "pumonary embolisms", and other heart problems.  The amount of time that you may need off of the medication may also vary with the medication and the reason for which you were taking it.  If you are taking any of these medications, please make sure you notify your pain physician before you  undergo any procedures.

## 2014-12-06 ENCOUNTER — Encounter
Admission: RE | Admit: 2014-12-06 | Discharge: 2014-12-06 | Disposition: A | Discharge status: Home / Self Care | Payer: 59 | Source: Ambulatory Visit | Attending: Specialist | Admitting: Specialist

## 2014-12-06 ENCOUNTER — Telehealth: Payer: Self-pay | Admitting: Obstetrics and Gynecology

## 2014-12-06 ENCOUNTER — Telehealth: Payer: Self-pay

## 2014-12-06 HISTORY — DX: Unspecified urinary incontinence: R32

## 2014-12-06 HISTORY — DX: Anxiety disorder, unspecified: F41.9

## 2014-12-06 HISTORY — DX: Adverse effect of unspecified anesthetic, initial encounter: T41.45XA

## 2014-12-06 HISTORY — DX: Other complications of anesthesia, initial encounter: T88.59XA

## 2014-12-06 NOTE — Patient Instructions (Signed)
  Your procedure is scheduled on: 12/07/14 Tues at 12:15  Report to Day Surgery. Remember: Instructions that are not followed completely may result in serious medical risk, up to and including death, or upon the discretion of your surgeon and anesthesiologist your surgery may need to be rescheduled.    __x__ 1. Do not eat food or drink liquids after midnight. No gum chewing or hard candies.     ____ 2. No Alcohol for 24 hours before or after surgery.   ____ 3. Bring all medications with you on the day of surgery if instructed.    _x___ 4. Notify your doctor if there is any change in your medical condition     (cold, fever, infections).     Do not wear jewelry, make-up, hairpins, clips or nail polish.  Do not wear lotions, powders, or perfumes. You may wear deodorant.  Do not shave 48 hours prior to surgery. Men may shave face and neck.  Do not bring valuables to the hospital.    [facility] is not responsible for any belongings or valuables.               Contacts, dentures or bridgework may not be worn into surgery.  Leave your suitcase in the car. After surgery it may be brought to your room.  For patients admitted to the hospital, discharge time is determined by your                treatment team.   Patients discharged the day of surgery will not be allowed to drive home.   Please read over the following fact sheets that you were given:      _x___ Take these medicines the morning of surgery with A SIP OF WATER:    1.LORazepam (ATIVAN) 2 MG tablet  2. venlafaxine (EFFEXOR) 100 MG tablet  3. estrogen, conjugated,-medroxyprogesterone (PREMPRO) 0.3-1.5 MG per tablet  4.  5.  6.  ____ Fleet Enema (as directed)   _x___ Use CHG Soap as directed  ____ Use inhalers on the day of surgery  ____ Stop metformin 2 days prior to surgery    ____ Take 1/2 of usual insulin dose the night before surgery and none on the morning of surgery.   ____ Stop Coumadin/Plavix/aspirin on   ____  Stop Anti-inflammatories on    ____ Stop supplements until after surgery.    ____ Bring C-Pap to the hospital.

## 2014-12-06 NOTE — Telephone Encounter (Signed)
Called pharmacy, some of patients medication cant be chewable or liquid, advised pt to call which dr is doing surgery and discuss with them

## 2014-12-06 NOTE — Telephone Encounter (Signed)
Erika Shelton, and Juliann Pulse,  Thank you for follow-up information  Please cancel patient's appointment

## 2014-12-06 NOTE — Telephone Encounter (Signed)
Pt is having surgery / cancel 12-08-14 appt

## 2014-12-06 NOTE — Telephone Encounter (Signed)
PT CALLED AND SHE IS HAVING SURGERY TOMORROW AND SHE CAN ONLY HAVE LIQUIDS AND CHEWABLE ITEMS, SO SHE NEEDS HER MED CALLED IN EITHER AS LIQUID OR CHEWABLE. [facility]

## 2014-12-07 ENCOUNTER — Encounter: Payer: Self-pay | Admitting: *Deleted

## 2014-12-07 ENCOUNTER — Inpatient Hospital Stay
Admission: RE | Admit: 2014-12-07 | Discharge: 2014-12-09 | DRG: 621 | Disposition: A | Discharge status: Home / Self Care | Payer: 59 | Source: Ambulatory Visit | Attending: Specialist | Admitting: Specialist

## 2014-12-07 ENCOUNTER — Inpatient Hospital Stay: Payer: 59 | Admitting: Anesthesiology

## 2014-12-07 ENCOUNTER — Encounter
Admission: RE | Disposition: A | Discharge status: Home / Self Care | Payer: Self-pay | Source: Ambulatory Visit | Attending: Specialist

## 2014-12-07 DIAGNOSIS — M5116 Intervertebral disc disorders with radiculopathy, lumbar region: Secondary | ICD-10-CM | POA: Diagnosis present

## 2014-12-07 DIAGNOSIS — J45909 Unspecified asthma, uncomplicated: Secondary | ICD-10-CM | POA: Diagnosis present

## 2014-12-07 DIAGNOSIS — Z8249 Family history of ischemic heart disease and other diseases of the circulatory system: Secondary | ICD-10-CM | POA: Diagnosis not present

## 2014-12-07 DIAGNOSIS — K9189 Other postprocedural complications and disorders of digestive system: Secondary | ICD-10-CM

## 2014-12-07 DIAGNOSIS — Z79899 Other long term (current) drug therapy: Secondary | ICD-10-CM

## 2014-12-07 DIAGNOSIS — Z79890 Hormone replacement therapy (postmenopausal): Secondary | ICD-10-CM | POA: Diagnosis not present

## 2014-12-07 DIAGNOSIS — Z791 Long term (current) use of non-steroidal anti-inflammatories (NSAID): Secondary | ICD-10-CM | POA: Diagnosis not present

## 2014-12-07 DIAGNOSIS — F419 Anxiety disorder, unspecified: Secondary | ICD-10-CM | POA: Diagnosis present

## 2014-12-07 DIAGNOSIS — Z825 Family history of asthma and other chronic lower respiratory diseases: Secondary | ICD-10-CM | POA: Diagnosis not present

## 2014-12-07 DIAGNOSIS — Z6841 Body Mass Index (BMI) 40.0 and over, adult: Secondary | ICD-10-CM

## 2014-12-07 DIAGNOSIS — R32 Unspecified urinary incontinence: Secondary | ICD-10-CM | POA: Diagnosis present

## 2014-12-07 DIAGNOSIS — M533 Sacrococcygeal disorders, not elsewhere classified: Secondary | ICD-10-CM | POA: Diagnosis present

## 2014-12-07 HISTORY — PX: LAPAROSCOPIC GASTRIC RESTRICTIVE DUODENAL PROCEDURE (DUODENAL SWITCH): SHX6667

## 2014-12-07 LAB — CBC
HCT: 37.3 % (ref 35.0–47.0)
Hemoglobin: 11.8 g/dL — ABNORMAL LOW (ref 12.0–16.0)
MCH: 28.3 pg (ref 26.0–34.0)
MCHC: 31.7 g/dL — ABNORMAL LOW (ref 32.0–36.0)
MCV: 89.3 fL (ref 80.0–100.0)
Platelets: 250 10*3/uL (ref 150–440)
RBC: 4.18 MIL/uL (ref 3.80–5.20)
RDW: 15.4 % — ABNORMAL HIGH (ref 11.5–14.5)
WBC: 22 10*3/uL — ABNORMAL HIGH (ref 3.6–11.0)

## 2014-12-07 LAB — CREATININE, SERUM
Creatinine, Ser: 0.76 mg/dL (ref 0.44–1.00)
GFR calc Af Amer: >60 mL/min (ref >60)
GFR calc non Af Amer: >60 mL/min (ref >60)

## 2014-12-07 SURGERY — LAPAROSCOPIC GASTRIC RESTRICTIVE DUODENAL PROCEDURE (DUODENAL SWITCH)
Anesthesia: General | Wound class: Clean

## 2014-12-07 MED ORDER — ONDANSETRON HCL 4 MG/2ML IJ SOLN
4.0000 mg | INTRAMUSCULAR | Status: DC | PRN
Start: 2014-12-07 — End: 2014-12-09
  Administered 2014-12-07 – 2014-12-08 (×5): 4 mg via INTRAVENOUS
  Filled 2014-12-07 (×5): qty 2

## 2014-12-07 MED ORDER — DEXAMETHASONE SODIUM PHOSPHATE 4 MG/ML IJ SOLN
INTRAMUSCULAR | Status: DC | PRN
  Administered 2014-12-07: 10 mg via INTRAVENOUS

## 2014-12-07 MED ORDER — SODIUM CHLORIDE 0.9 % IJ SOLN
INTRAMUSCULAR | Status: AC
  Filled 2014-12-07: qty 3

## 2014-12-07 MED ORDER — BUPIVACAINE-EPINEPHRINE (PF) 0.5% -1:200000 IJ SOLN
INTRAMUSCULAR | Status: AC
  Filled 2014-12-07: qty 30

## 2014-12-07 MED ORDER — ONDANSETRON HCL 4 MG/2ML IJ SOLN
INTRAMUSCULAR | Status: DC | PRN
  Administered 2014-12-07 (×2): 4 mg via INTRAVENOUS

## 2014-12-07 MED ORDER — ONDANSETRON HCL 4 MG/2ML IJ SOLN: 4.0000 mg | Freq: Once | INTRAMUSCULAR | Status: DC | PRN

## 2014-12-07 MED ORDER — LIDOCAINE-EPINEPHRINE (PF) 1 %-1:200000 IJ SOLN
INTRAMUSCULAR | Status: AC
  Filled 2014-12-07: qty 30

## 2014-12-07 MED ORDER — UNJURY CHOCOLATE CLASSIC POWDER: 2.0000 [oz_av] | Freq: Four times a day (QID) | ORAL | Status: DC

## 2014-12-07 MED ORDER — SODIUM CHLORIDE 0.9 % IJ SOLN
INTRAMUSCULAR | Status: AC
  Filled 2014-12-07: qty 10

## 2014-12-07 MED ORDER — MORPHINE SULFATE 2 MG/ML IJ SOLN
2.0000 mg | INTRAMUSCULAR | Status: DC | PRN
  Administered 2014-12-07 – 2014-12-08 (×10): 4 mg via INTRAVENOUS
  Filled 2014-12-07 (×11): qty 2

## 2014-12-07 MED ORDER — LACTATED RINGERS IV SOLN
INTRAVENOUS | Status: DC
  Administered 2014-12-07: 13:00:00 via INTRAVENOUS

## 2014-12-07 MED ORDER — SODIUM CHLORIDE 0.9 % IV SOLN
INTRAVENOUS | Status: DC
  Administered 2014-12-07 – 2014-12-08 (×4): via INTRAVENOUS

## 2014-12-07 MED ORDER — SCOPOLAMINE 1 MG/3DAYS TD PT72
MEDICATED_PATCH | TRANSDERMAL | Status: AC
  Administered 2014-12-07: 1.5 mg via TRANSDERMAL
  Filled 2014-12-07: qty 1

## 2014-12-07 MED ORDER — UNJURY VANILLA POWDER: 2.0000 [oz_av] | Freq: Four times a day (QID) | ORAL | Status: DC

## 2014-12-07 MED ORDER — MIDAZOLAM HCL 2 MG/2ML IJ SOLN
INTRAMUSCULAR | Status: DC | PRN
  Administered 2014-12-07: 2 mg via INTRAVENOUS

## 2014-12-07 MED ORDER — ACETAMINOPHEN 10 MG/ML IV SOLN: 1000.0000 mg | Freq: Once | INTRAVENOUS | Status: DC

## 2014-12-07 MED ORDER — PROMETHAZINE HCL 25 MG/ML IJ SOLN
6.2500 mg | Freq: Once | INTRAMUSCULAR | Status: AC
  Administered 2014-12-07: 6.25 mg via INTRAVENOUS

## 2014-12-07 MED ORDER — OXYCODONE HCL 5 MG/5ML PO SOLN
5.0000 mg | ORAL | Status: DC | PRN
  Administered 2014-12-09 (×2): 10 mg via ORAL
  Filled 2014-12-07 (×2): qty 10

## 2014-12-07 MED ORDER — LIDOCAINE-EPINEPHRINE (PF) 1 %-1:200000 IJ SOLN
INTRAMUSCULAR | Status: DC | PRN
  Administered 2014-12-07: 11 mL

## 2014-12-07 MED ORDER — UNJURY CHICKEN SOUP POWDER: 2.0000 [oz_av] | Freq: Four times a day (QID) | ORAL | Status: DC

## 2014-12-07 MED ORDER — BUPIVACAINE-EPINEPHRINE (PF) 0.5% -1:200000 IJ SOLN
INTRAMUSCULAR | Status: DC | PRN
  Administered 2014-12-07: 10 mL

## 2014-12-07 MED ORDER — HYDROMORPHONE HCL 1 MG/ML IJ SOLN
INTRAMUSCULAR | Status: AC
  Administered 2014-12-07: 0.25 mg via INTRAVENOUS
  Filled 2014-12-07: qty 1

## 2014-12-07 MED ORDER — FENTANYL CITRATE (PF) 100 MCG/2ML IJ SOLN: 25.0000 ug | INTRAMUSCULAR | Status: DC | PRN

## 2014-12-07 MED ORDER — NEOSTIGMINE METHYLSULFATE 10 MG/10ML IV SOLN
INTRAVENOUS | Status: DC | PRN
  Administered 2014-12-07: 5 mg via INTRAVENOUS

## 2014-12-07 MED ORDER — ACETAMINOPHEN 160 MG/5ML PO SOLN
325.0000 mg | ORAL | Status: DC | PRN
  Filled 2014-12-07: qty 20.3

## 2014-12-07 MED ORDER — SCOPOLAMINE 1 MG/3DAYS TD PT72
1.0000 | MEDICATED_PATCH | TRANSDERMAL | Status: DC
  Administered 2014-12-07: 1.5 mg via TRANSDERMAL

## 2014-12-07 MED ORDER — DEXTROSE 5 % IV SOLN
2.0000 g | Freq: Three times a day (TID) | INTRAVENOUS | Status: AC
  Administered 2014-12-07 – 2014-12-08 (×2): 2 g via INTRAVENOUS
  Filled 2014-12-07 (×2): qty 2

## 2014-12-07 MED ORDER — CEFOXITIN SODIUM-DEXTROSE 2-2.2 GM-% IV SOLR (PREMIX)
INTRAVENOUS | Status: AC
  Administered 2014-12-07: 2000 mg
  Filled 2014-12-07: qty 50

## 2014-12-07 MED ORDER — PROPOFOL 10 MG/ML IV BOLUS
INTRAVENOUS | Status: DC | PRN
  Administered 2014-12-07: 200 mg via INTRAVENOUS

## 2014-12-07 MED ORDER — MIDAZOLAM HCL 2 MG/2ML IJ SOLN: INTRAMUSCULAR | Status: DC | PRN

## 2014-12-07 MED ORDER — GLYCOPYRROLATE 0.2 MG/ML IJ SOLN
INTRAMUSCULAR | Status: DC | PRN
  Administered 2014-12-07: .8 mg via INTRAVENOUS

## 2014-12-07 MED ORDER — ACETAMINOPHEN 10 MG/ML IV SOLN
1000.0000 mg | Freq: Four times a day (QID) | INTRAVENOUS | Status: AC
Start: 2014-12-07 — End: 2014-12-08
  Administered 2014-12-07 – 2014-12-08 (×3): 1000 mg via INTRAVENOUS
  Filled 2014-12-07 (×4): qty 100

## 2014-12-07 MED ORDER — FENTANYL CITRATE (PF) 100 MCG/2ML IJ SOLN
INTRAMUSCULAR | Status: DC | PRN
  Administered 2014-12-07 (×7): 50 ug via INTRAVENOUS

## 2014-12-07 MED ORDER — LACTATED RINGERS IV SOLN
INTRAVENOUS | Status: DC | PRN
Start: 2014-12-07 — End: 2014-12-07
  Administered 2014-12-07 (×2): via INTRAVENOUS

## 2014-12-07 MED ORDER — PROMETHAZINE HCL 25 MG/ML IJ SOLN
INTRAMUSCULAR | Status: AC
Start: 2014-12-07 — End: 2014-12-07
  Administered 2014-12-07: 6.25 mg via INTRAVENOUS
  Filled 2014-12-07: qty 1

## 2014-12-07 MED ORDER — LACTATED RINGERS IV SOLN
INTRAVENOUS | Status: DC | PRN
  Administered 2014-12-07: 14:00:00 via INTRAVENOUS

## 2014-12-07 MED ORDER — ACETAMINOPHEN 160 MG/5ML PO SOLN
650.0000 mg | ORAL | Status: DC | PRN
  Administered 2014-12-09: 650 mg via ORAL
  Filled 2014-12-07: qty 20.3

## 2014-12-07 MED ORDER — ROCURONIUM BROMIDE 100 MG/10ML IV SOLN
INTRAVENOUS | Status: DC | PRN
  Administered 2014-12-07: 10 mg via INTRAVENOUS
  Administered 2014-12-07: 50 mg via INTRAVENOUS
  Administered 2014-12-07 (×2): 20 mg via INTRAVENOUS

## 2014-12-07 MED ORDER — LIDOCAINE HCL (CARDIAC) 20 MG/ML IV SOLN
INTRAVENOUS | Status: DC | PRN
  Administered 2014-12-07: 100 mg via INTRAVENOUS

## 2014-12-07 MED ORDER — CEFOXITIN SODIUM-DEXTROSE 2-2.2 GM-% IV SOLR (PREMIX)
2.0000 g | Freq: Once | INTRAVENOUS | Status: AC
  Administered 2014-12-07: 2 g via INTRAVENOUS

## 2014-12-07 MED ORDER — HYDROMORPHONE HCL 1 MG/ML IJ SOLN
0.2500 mg | INTRAMUSCULAR | Status: DC | PRN
  Administered 2014-12-07 (×8): 0.25 mg via INTRAVENOUS

## 2014-12-07 MED ORDER — ACETAMINOPHEN 10 MG/ML IV SOLN
INTRAVENOUS | Status: AC
Start: 2014-12-07 — End: 2014-12-07
  Administered 2014-12-07: 1000 mg via INTRAVENOUS
  Filled 2014-12-07: qty 100

## 2014-12-07 MED ORDER — LORAZEPAM 2 MG PO TABS
2.0000 mg | ORAL_TABLET | Freq: Two times a day (BID) | ORAL | Status: DC
  Administered 2014-12-07 – 2014-12-09 (×4): 2 mg via ORAL
  Filled 2014-12-07 (×4): qty 1

## 2014-12-07 MED ORDER — EVICEL 5 ML EX KIT
PACK | CUTANEOUS | Status: DC | PRN
  Administered 2014-12-07: 5 mL

## 2014-12-07 MED ORDER — ENOXAPARIN SODIUM 30 MG/0.3ML ~~LOC~~ SOLN
30.0000 mg | Freq: Two times a day (BID) | SUBCUTANEOUS | Status: DC
  Administered 2014-12-08 – 2014-12-09 (×3): 30 mg via SUBCUTANEOUS
  Filled 2014-12-07 (×3): qty 0.3

## 2014-12-07 SURGICAL SUPPLY — 63 items
APPLIER CLIP ROT 13.4 12 LRG (CLIP) ×2
BANDAGE ELASTIC 6 CLIP NS LF (GAUZE/BANDAGES/DRESSINGS) ×4 IMPLANT
BLADE SURG SZ11 CARB STEEL (BLADE) ×2 IMPLANT
BULB RESERV EVAC DRAIN JP 100C (MISCELLANEOUS) ×2 IMPLANT
CANISTER SUCT 1200ML W/VALVE (MISCELLANEOUS) ×2 IMPLANT
CHLORAPREP W/TINT 26ML (MISCELLANEOUS) ×2 IMPLANT
CLIP APPLIE ROT 13.4 12 LRG (CLIP) ×1 IMPLANT
DEFOGGER SCOPE WARMER CLEARIFY (MISCELLANEOUS) ×2 IMPLANT
DRAIN CHANNEL JP 19F (MISCELLANEOUS) ×2 IMPLANT
DRAPE UTILITY 15X26 TOWEL STRL (DRAPES) ×4 IMPLANT
ENDOPOUCH RETRIEVER 10 (MISCELLANEOUS) ×2 IMPLANT
EVICEL 5ML SEALNT HUMAN B (Miscellaneous) ×2 IMPLANT
FILTER LAP SMOKE EVAC STRL (MISCELLANEOUS) ×2 IMPLANT
GLOVE BIO SURGEON STRL SZ 6.5 (GLOVE) ×10 IMPLANT
GLOVE BIO SURGEON STRL SZ8 (GLOVE) IMPLANT
GOWN STRL REUS W/ TWL LRG LVL3 (GOWN DISPOSABLE) ×3 IMPLANT
GOWN STRL REUS W/ TWL XL LVL3 (GOWN DISPOSABLE) IMPLANT
GOWN STRL REUS W/TWL LRG LVL3 (GOWN DISPOSABLE) ×3
GOWN STRL REUS W/TWL XL LVL3 (GOWN DISPOSABLE)
IRRIGATION STRYKERFLOW (MISCELLANEOUS) ×1 IMPLANT
IRRIGATOR STRYKERFLOW (MISCELLANEOUS) ×2
IV NS 1000ML (IV SOLUTION) ×1
IV NS 1000ML BAXH (IV SOLUTION) ×1 IMPLANT
KIT RM TURNOVER STRD PROC AR (KITS) ×2 IMPLANT
LABEL OR SOLS (LABEL) ×2 IMPLANT
LIQUID BAND (GAUZE/BANDAGES/DRESSINGS) ×2 IMPLANT
NDL INSUFF 14G 150MM VS150000 (NEEDLE) ×2 IMPLANT
NDL SAFETY 22GX1.5 (NEEDLE) ×2 IMPLANT
NS IRRIG 1000ML POUR BTL (IV SOLUTION) ×2 IMPLANT
PACK LAP CHOLECYSTECTOMY (MISCELLANEOUS) ×2 IMPLANT
RELOAD BLUE (STAPLE) ×2 IMPLANT
RELOAD STAPLER GOLD 60MM (STAPLE) ×1 IMPLANT
RELOAD STAPLER GREEN 60MM (STAPLE) IMPLANT
RELOAD STAPLER WHITE 60MM (STAPLE) ×2 IMPLANT
SHEARS HARMONIC ACE PLUS 45CM (MISCELLANEOUS) ×2 IMPLANT
SLEEVE ENDOPATH XCEL 5M (ENDOMECHANICALS) ×4 IMPLANT
SLEEVE GASTRECTOMY 40FR VISIGI (MISCELLANEOUS) ×2 IMPLANT
STAPLER ECHELON BIOABSB 60 FLE (MISCELLANEOUS) ×6 IMPLANT
STAPLER ECHELON LONG 60 440 (INSTRUMENTS) IMPLANT
STAPLER RELOAD GOLD 60MM (STAPLE) ×2
STAPLER RELOAD GREEN 60MM (STAPLE)
STAPLER RELOAD WHITE 60MM (STAPLE) ×4
SUT DEVICE BRAIDED 0X39 (SUTURE) ×2 IMPLANT
SUT DEVICE BRAIDED 2.0X39 (SUTURE) ×8 IMPLANT
SUT DVC ABSORB BRAID 3.0X39 (SUTURE) ×12 IMPLANT
SUT DVC VICRYL PGA 2.0X39 (SUTURE) ×8 IMPLANT
SUT ETHILON 2 0 FS 18 (SUTURE) IMPLANT
SUT ETHILON 3 0 PS 1 (SUTURE) ×2 IMPLANT
SUT MNCRL 4-0 (SUTURE)
SUT MNCRL 4-0 27XMFL (SUTURE)
SUT VIC AB 4-0 FS2 27 (SUTURE) ×2 IMPLANT
SUT VIC AB 4-0 PS2 18 (SUTURE) ×2 IMPLANT
SUT VICRYL/POLYSORB 3.0 (SUTURE) ×4 IMPLANT
SUTURE MNCRL 4-0 27XMF (SUTURE) IMPLANT
SYR 20CC LL (SYRINGE) ×2 IMPLANT
TIP RIGID 35CM EVICEL (HEMOSTASIS) ×2 IMPLANT
TROCAR BLADELESS 15MM (ENDOMECHANICALS) ×2 IMPLANT
TROCAR SL VERSASTEP 5M LG  B (MISCELLANEOUS)
TROCAR SL VERSASTEP 5M LG B (MISCELLANEOUS) IMPLANT
TROCAR XCEL 12X100 BLDLESS (ENDOMECHANICALS) ×2 IMPLANT
TROCAR XCEL NON-BLD 5MMX100MML (ENDOMECHANICALS) ×2 IMPLANT
TUBING INSUFFLATOR HEATED (MISCELLANEOUS) ×2 IMPLANT
WATER STERILE IRR 1000ML POUR (IV SOLUTION) ×2 IMPLANT

## 2014-12-07 NOTE — Anesthesia Postprocedure Evaluation (Signed)
  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

## 2014-12-07 NOTE — Progress Notes (Signed)
Pt was admitted from PACU to room 202.    IV access in place and fluids started.  Medications administered (See MAR).  Admission completed.  Assessment completed.  DVT prophylaxis assessed and completed.    Education provided on call bell, bed alarm, telephone and IV/IV Pole/IV Alarms.  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  White Board was completed and updated PRN.    Dietary specification confirmed.    VSS.  Family at bedside.  Pt resting comfortably and quietly w/bed in low and locked position and call bell and telephone within reach.  Will continue to monitor.

## 2014-12-07 NOTE — Progress Notes (Signed)
Called Dr. Kreg Shropshire and asked him to order ativan 2mg  per patient request.  Doctor consented and order was put in. Christene Slates  12/07/2014  9:59 PM

## 2014-12-07 NOTE — H&P (Signed)
Subjective: The patient is a 49 y.o. obese female admitted for gastric bypass surgery. There is no weight on file to calculate BMI.   Bariatric comorbidities present: none  Bariatric comorbidities absent: none  Patient Active Problem List   Diagnosis Date Noted  . DDD (degenerative disc disease), lumbar 11/23/2014  . Facet syndrome, lumbar 11/23/2014  . Sacroiliac joint dysfunction 11/23/2014  . Lumbar radiculopathy 11/23/2014   Past Medical History  Diagnosis Date  . Asthma   . Anxiety   . Urinary incontinence   . Complication of anesthesia     nausea    Past Surgical History  Procedure Laterality Date  . Vaginal hysterectomy    . Foot surgery Bilateral   . Laparoscopic gastric banding    . Laparoscopic gastric sleeve resection    . Bladder surgery      X2   . Interstem       Prescriptions prior to admission  Medication Sig Dispense Refill Last Dose  . cefUROXime (CEFTIN) 250 MG tablet Take 1 tablet (250 mg total) by mouth 2 (two) times daily with a meal. 14 tablet 0   . cephALEXin (KEFLEX) 500 MG capsule Take 1 capsule (500 mg total) by mouth 3 (three) times daily. (Patient not taking: Reported on 11/23/2014) 30 capsule 0 Not Taking  . estrogen, conjugated,-medroxyprogesterone (PREMPRO) 0.3-1.5 MG per tablet Take 1 tablet by mouth daily.   Taking  . fluconazole (DIFLUCAN) 150 MG tablet Limit 1 tablet by mouth times 1 day 1 tablet 0   . LORazepam (ATIVAN) 2 MG tablet Take 2 mg by mouth daily.   Taking  . meloxicam (MOBIC) 15 MG tablet Take 1 tablet (15 mg total) by mouth daily. (Patient not taking: Reported on 11/23/2014) 30 tablet 3 Not Taking  . meperidine (DEMEROL) 50 MG tablet Take one to two capsules every 6 to 8 hours as needed for pain. (Patient not taking: Reported on 11/23/2014) 50 tablet 0 Not Taking  . oxyCODONE-acetaminophen (PERCOCET) 10-325 MG per tablet Take one to two tablets by mouth every six to eight hours as needed for pain. (Patient not taking: Reported on  11/23/2014) 50 tablet 0 Not Taking  . promethazine (PHENERGAN) 25 MG tablet Take 1 tablet (25 mg total) by mouth every 8 (eight) hours as needed for nausea. (Patient not taking: Reported on 11/23/2014) 20 tablet 0 Not Taking  . venlafaxine (EFFEXOR) 100 MG tablet Take 100 mg by mouth 2 (two) times daily.   Taking  . zolpidem (AMBIEN) 10 MG tablet Take 1 tablet (10 mg total) by mouth at bedtime as needed for sleep. 30 tablet 0    Allergies  Allergen Reactions  . Ciprofloxacin Nausea And Vomiting  . Darvocet [Propoxyphene N-Acetaminophen] Itching  . Hydrocodone Itching  . Percocet [Oxycodone-Acetaminophen] Itching  . Vicodin [Hydrocodone-Acetaminophen] Itching    History  Substance Use Topics  . Smoking status: Never Smoker   . Smokeless tobacco: Never Used  . Alcohol Use: No     Comment: OCCASIONALLY    Family History  Problem Relation Age of Onset  . Arthritis Mother   . Asthma Mother   . COPD Mother   . Depression Mother   . Heart disease Mother   . Hyperlipidemia Mother   . Hypertension Mother   . Cancer Father     Review of Systems Pertinent items are noted in HPI.  Objective: Vital signs in last 24 hours: Pulse Rate:  [75] 75 (06/20 1329) BP: (106)/(71) 106/71 mmHg (06/20 1329) SpO2:  [  100 %] 100 % (06/20 1329)  There were no vitals taken for this visit. General appearance: alert, cooperative and appears stated age Lungs: clear to auscultation bilaterally Heart: regular rate and rhythm, S1, S2 normal, no murmur, click, rub or gallop Abdomen: soft, non-tender; bowel sounds normal; no masses,  no organomegaly  Data Review: none  Assessment/Plan: Morbid obesity with failure of conservative therapy. plam revision to duodenal switch  The patient was informed that risks include, but are not limited to: death, anastomotic leak, obstruction, bleeding, and sepsis. Any of these could require further surgery. Other risks include DVT, PE, pneumonia, wound dehiscence, hernia,  wound infection, the need for dilatations of her gastrojejunostomy, and the inability to lose appropriate weight and keep it off.   We discussed that our goal is to ameliorate her medical problems and not to obtain a specific body mass index. She understands the risks and benefits and wishes to proceed with the procedure. She has signed a consent form.   Date of Surgery Update (To be completed by Attending Surgeon day of surgery.)  There have been no significant clinical changes since the completion of the above H&P.

## 2014-12-07 NOTE — Anesthesia Preprocedure Evaluation (Signed)
Anesthesia Evaluation  Patient identified by MRN, date of birth, ID band Patient awake    Reviewed: Allergy & Precautions, NPO status , Patient's Chart, lab work & pertinent test results  History of Anesthesia Complications (+) PONV and history of anesthetic complications  Airway Mallampati: II  TM Distance: >3 FB Neck ROM: Full    Dental no notable dental hx.    Pulmonary asthma ,  breath sounds clear to auscultation  Pulmonary exam normal       Cardiovascular Exercise Tolerance: Good negative cardio ROS Normal cardiovascular examRhythm:Regular Rate:Normal     Neuro/Psych Anxiety negative neurological ROS     GI/Hepatic negative GI ROS, Neg liver ROS,   Endo/Other  negative endocrine ROS  Renal/GU negative Renal ROS  negative genitourinary   Musculoskeletal  (+) Arthritis -, Osteoarthritis,    Abdominal   Peds negative pediatric ROS (+)  Hematology negative hematology ROS (+)   Anesthesia Other Findings   Reproductive/Obstetrics negative OB ROS                             Anesthesia Physical Anesthesia Plan  ASA: III  Anesthesia Plan: General   Post-op Pain Management:    Induction: Intravenous  Airway Management Planned: Oral ETT  Additional Equipment:   Intra-op Plan:   Post-operative Plan: Extubation in OR  Informed Consent: I have reviewed the patients History and Physical, chart, labs and discussed the procedure including the risks, benefits and alternatives for the proposed anesthesia with the patient or authorized representative who has indicated his/her understanding and acceptance.   Dental advisory given  Plan Discussed with: CRNA and Surgeon  Anesthesia Plan Comments:         Anesthesia Quick Evaluation

## 2014-12-07 NOTE — Op Note (Signed)
Preoperative diagnosis: Morbid obesity Postoperative diagnosis: Same; previously gastrectomy Procedure: Laparoscopic revision of sleeve to biliopancreatic diversion duodenal switch double anastomosis Surgeon: Darnell Level Asst.: Link Snuffer PA Complications: None Specimens: None Alimentary channel: 300 cm Common channel: 250 cm Drains: 19 French Blake left upper quadrant Blood loss: None  Clinical history: The patient is a 49 year old female who requests revision of her sleeve gastrectomy. She has lost weight and regained weight. She requests revision to a biliopancreatic diversion duodenal switch. The risk and benefits of procedure were discussed with the patient and she elected to proceed.  Details of procedure: The patient was setting the operating room placed in the operating table in supine position. Appropriate monitors submental action were given. Broad-spectrum IV advice given. Sequential stockings were placed. Timeout was performed. The patient was placed under general anesthesia without incident. The abdomen was prepped and draped in usual sterile fashion. The abdomen was accessed using a 5 mm optical trocar and left upper outer quadrant. Whether trochars were placed in standard fashion preparation for the creation of a 2 anastomosis biliopancreatic diversion duodenal switch.  Adhesions were present to the sleeve into the liver. These were taken down without bleeding using Harmonic scalpel. Once these are totally freed up the duodenum was further mobilized down to the point of its juncture with the pancreas. At that point the duodenum was divided using a gold load with buttressing without incident. The duodenum was slightly mobilized the superior pole without devascularization of the duodenum. The postpyloric duodenum was then sutured to a 300 cm limb of bowel measured from the ileocecal valve. It was sutured initially to the posterior mesenteric border of bowel using a running 2-0 Polysorb  suture. Enterotomies were made in the duodenum parallel to the staple line and the loop of ileum as well. The enterotomies were then closed using running 3-0 Polysorb suture. A posterior row was sewn first from top to bottom creating a 2 layer posterior suture line. An anterior row is sutured from top to bottom as well in a similar fashion and tied to the posterior row. I then buttressed the inferior portion of the anastomosis to be sure that there was no leak at that point and ran that suture further up and transition to just oversewing the suture line about halfway up. A leak test was then performed by advancing a 40 [place] visi-G and insufflating through this with the proximal and distal limb clamp. No leak was detected under saline with insufflation.  The biliopancreatic limb was then divided from the anastomosis and reconnected 50 cm downstream connecting and creating a 250 cm common channel. A side-to-side anastomosis was created in the following fashion. Stay sutures were made to approximate the 2 limbs and enterotomies were made in the alimentary channel and the biliopancreatic limb. A white load 60 mm stapler was inserted and both lumen and fired. The resulting defect was reapproximated using interrupted 20 Surgidek sutures. A blue load stapler is then used to close the defect. Clips were placed on a very scant oozing present in the staple line. The mesenteric defect was closed using running 0 Surgidac suture.  A 19 [place] Blake drain was then placed left upper quadrant through one of the trocar sites. There was sutured in place using a 3-0 nylon suture. evicel was then placed over the anastomosis. The remaining trochars were removed and closed the incisions with 4-0 Vicryl and Dermabond. The patient tolerated the procedure well and was stable throughout the entire procedure.

## 2014-12-07 NOTE — Transfer of Care (Signed)
Immediate Anesthesia Transfer of Care Note  Patient: Erika Shelton  Procedure(s) Performed: Procedure(s): LAPAROSCOPIC GASTRIC RESTRICTIVE DUODENAL PROCEDURE (DUODENAL SWITCH) (N/A)  Patient Location: PACU  Anesthesia Type:General  Level of Consciousness: awake, alert , oriented and patient cooperative  Airway & Oxygen Therapy: Patient Spontanous Breathing and Patient connected to face mask oxygen  Post-op Assessment: Report given to RN, Post -op Vital signs reviewed and stable and Patient moving all extremities X 4  Post vital signs: Reviewed and stable  Last Vitals:  Filed Vitals:   12/07/14 1659  BP: 142/85  Pulse: 104  Temp: 36.2 C  Resp: 12    Complications: No apparent anesthesia complications

## 2014-12-07 NOTE — Anesthesia Procedure Notes (Signed)
Procedure Name: Intubation Date/Time: 12/07/2014 2:13 PM Performed by: Silvana Newness Pre-anesthesia Checklist: Patient identified, Emergency Drugs available, Suction available, Patient being monitored and Timeout performed Patient Re-evaluated:Patient Re-evaluated prior to inductionOxygen Delivery Method: Circle system utilized Preoxygenation: Pre-oxygenation with 100% oxygen Intubation Type: IV induction Ventilation: Mask ventilation without difficulty Laryngoscope Size: Mac and 3 Grade View: Grade I Tube type: Oral Tube size: 7.0 mm Number of attempts: 1 Airway Equipment and Method: Rigid stylet Placement Confirmation: ETT inserted through vocal cords under direct vision,  positive ETCO2 and breath sounds checked- equal and bilateral Secured at: 22 cm Tube secured with: Tape Dental Injury: Teeth and Oropharynx as per pre-operative assessment

## 2014-12-08 ENCOUNTER — Inpatient Hospital Stay: Payer: 59

## 2014-12-08 ENCOUNTER — Encounter: Payer: Self-pay | Admitting: Specialist

## 2014-12-08 ENCOUNTER — Ambulatory Visit: Payer: 59 | Admitting: Pain Medicine

## 2014-12-08 LAB — CBC WITH DIFFERENTIAL/PLATELET
Basophils Absolute: 0 10*3/uL (ref 0–0.1)
Basophils Relative: 0 %
Eosinophils Absolute: 0 10*3/uL (ref 0–0.7)
Eosinophils Relative: 0 %
HEMATOCRIT: 32.8 % — AB (ref 35.0–47.0)
HEMOGLOBIN: 10.6 g/dL — AB (ref 12.0–16.0)
LYMPHS ABS: 0.4 10*3/uL — AB (ref 1.0–3.6)
MCH: 28.3 pg (ref 26.0–34.0)
MCHC: 32.2 g/dL (ref 32.0–36.0)
MCV: 87.9 fL (ref 80.0–100.0)
MONO ABS: 0.3 10*3/uL (ref 0.2–0.9)
Neutro Abs: 10.1 10*3/uL — ABNORMAL HIGH (ref 1.4–6.5)
Neutrophils Relative %: 93 %
Platelets: 231 10*3/uL (ref 150–440)
RBC: 3.73 MIL/uL — AB (ref 3.80–5.20)
RDW: 15.2 % — ABNORMAL HIGH (ref 11.5–14.5)
WBC: 11 10*3/uL (ref 3.6–11.0)

## 2014-12-08 LAB — HEMOGLOBIN AND HEMATOCRIT, BLOOD
HCT: 32.8 % — ABNORMAL LOW (ref 35.0–47.0)
Hemoglobin: 10.5 g/dL — ABNORMAL LOW (ref 12.0–16.0)

## 2014-12-08 MED ORDER — UNJURY CHICKEN SOUP POWDER
2.0000 [oz_av] | Freq: Three times a day (TID) | ORAL | Status: DC
  Administered 2014-12-08: 2 [oz_av] via ORAL

## 2014-12-08 MED ORDER — VENLAFAXINE HCL 25 MG PO TABS: 100.0000 mg | ORAL_TABLET | Freq: Two times a day (BID) | ORAL | Status: DC

## 2014-12-08 MED ORDER — ESTROGENS CONJUGATED 0.3 MG PO TABS: 0.3000 mg | ORAL_TABLET | Freq: Every day | ORAL | Status: DC

## 2014-12-08 MED ORDER — VENLAFAXINE HCL ER 37.5 MG PO CP24
37.5000 mg | ORAL_CAPSULE | Freq: Two times a day (BID) | ORAL | Status: DC
  Administered 2014-12-08 – 2014-12-09 (×2): 37.5 mg via ORAL
  Filled 2014-12-08 (×3): qty 1

## 2014-12-08 MED ORDER — CONJ ESTROG-MEDROXYPROGEST ACE 0.3-1.5 MG PO TABS: 1.0000 | ORAL_TABLET | Freq: Every day | ORAL | Status: DC

## 2014-12-08 NOTE — Progress Notes (Signed)
Subjective: Interval History: has no complaint of pain or nausea tolerating PO.  Objective: Vital signs in last 24 hours: Temp:  [97.4 F (36.3 C)-98.7 F (37.1 C)] 98.1 F (36.7 C) January 01, 2023 1523) Pulse Rate:  [72-90] 77 2023/01/01 1523) Resp:  [10-18] 16 2023-01-01 1523) BP: (96-127)/(51-80) 103/60 mmHg 01/01/23 1523) SpO2:  [92 %-98 %] 96 % 2023-01-01 1523) Weight:  [99.338 kg (219 lb)] 99.338 kg (219 lb) (06/21 1807)  Intake/Output from previous day: 06/21 0701 - 01/01/2023 0700 In: 2957 [I.V.:2627] Out: 345 [Urine:280; Drains:40] Intake/Output this shift: Total I/O In: 1839.3 [P.O.:345; I.V.:1494.3] Out: 580 [Urine:300; Drains:280]  BP 103/60 mmHg  Pulse 77  Temp(Src) 98.1 F (36.7 C) (Oral)  Resp 16  Ht 5\' 2"  (1.575 m)  Wt 99.338 kg (219 lb)  BMI 40.05 kg/m2  SpO2 96% General appearance: alert Abdomen: soft, non-tender; bowel sounds normal; no masses,  no organomegaly  Results for orders placed or performed during the hospital encounter of 12/07/14 (from the past 24 hour(s))  CBC     Status: Abnormal   Collection Time: 12/07/14  7:17 PM  Result Value Ref Range   WBC 22.0 (H) 3.6 - 11.0 K/uL   RBC 4.18 3.80 - 5.20 MIL/uL   Hemoglobin 11.8 (L) 12.0 - 16.0 g/dL   HCT 37.3 35.0 - 47.0 %   MCV 89.3 80.0 - 100.0 fL   MCH 28.3 26.0 - 34.0 pg   MCHC 31.7 (L) 32.0 - 36.0 g/dL   RDW 15.4 (H) 11.5 - 14.5 %   Platelets 250 150 - 440 K/uL  Creatinine, serum     Status: None   Collection Time: 12/07/14  7:17 PM  Result Value Ref Range   Creatinine, Ser 0.76 0.44 - 1.00 mg/dL   GFR calc non Af Amer >60 >60 mL/min   GFR calc Af Amer >60 >60 mL/min  CBC WITH DIFFERENTIAL     Status: Abnormal   Collection Time: 01-01-2015  4:26 AM  Result Value Ref Range   WBC 11.0 3.6 - 11.0 K/uL   RBC 3.73 (L) 3.80 - 5.20 MIL/uL   Hemoglobin 10.6 (L) 12.0 - 16.0 g/dL   HCT 32.8 (L) 35.0 - 47.0 %   MCV 87.9 80.0 - 100.0 fL   MCH 28.3 26.0 - 34.0 pg   MCHC 32.2 32.0 - 36.0 g/dL   RDW 15.2 (H) 11.5 - 14.5  %   Platelets 231 150 - 440 K/uL   Neutrophils Relative % 93% %   Neutro Abs 10.1 (H) 1.4 - 6.5 K/uL   Lymphocytes Relative 4% %   Lymphs Abs 0.4 (L) 1.0 - 3.6 K/uL   Monocytes Relative 3% %   Monocytes Absolute 0.3 0.2 - 0.9 K/uL   Eosinophils Relative 0% %   Eosinophils Absolute 0.0 0 - 0.7 K/uL   Basophils Relative 0% %   Basophils Absolute 0.0 0 - 0.1 K/uL  Hemoglobin and hematocrit, blood     Status: Abnormal   Collection Time: 01/01/15  4:08 PM  Result Value Ref Range   Hemoglobin 10.5 (L) 12.0 - 16.0 g/dL   HCT 32.8 (L) 35.0 - 47.0 %    Studies/Results: Dg Ugi W/o Kub  01-01-2015   CLINICAL DATA:  Post bariatric surgery.  EXAM: WATER SOLUBLE UPPER GI SERIES  TECHNIQUE: Single-column upper GI series was performed using water soluble contrast.  CONTRAST:  Diluted Gastrografin.  COMPARISON:  None.  FLUOROSCOPY TIME:  Fluoroscopy Time (in minutes and seconds): 1 minutes 42 seconds  Number of Acquired Images:  15  FINDINGS: Thoracic esophagus is widely patent. Gastroesophageal junction is patent gastric remnant appears unremarkable. Gastric outflow was normal as was the proximal loop of bowel. Anastomoses are intact.  IMPRESSION: Normal post bariatric surgery GI .   Electronically Signed   By: Marcello Moores  Register   On: 12/08/2014 12:31    Scheduled Meds: . acetaminophen  1,000 mg Intravenous 4 times per day  . enoxaparin (LOVENOX) injection  30 mg Subcutaneous Q12H  . estrogen (conjugated)-medroxyprogesterone  1 tablet Oral Daily  . LORazepam  2 mg Oral BID  . protein supplement  2 oz Oral TID WC  . venlafaxine XR  37.5 mg Oral BID   Continuous Infusions: . sodium chloride 150 mL/hr at 12/08/14 1455   PRN Meds:oxyCODONE **AND** acetaminophen, acetaminophen (TYLENOL) oral liquid 160 mg/5 mL, morphine injection, ondansetron (ZOFRAN) IV  Assessment/Plan: doing well per nursing; PA to see tonight likely d/c tomorrow    LOS: 1 day   Erika Shelton

## 2014-12-08 NOTE — Plan of Care (Signed)
Problem: Food- and Nutrition-Related Knowledge Deficit (NB-1.1) Goal: Nutrition education Formal process to instruct or train a patient/client in a skill or to impart knowledge to help patients/clients voluntarily manage or modify food choices and eating behavior to maintain or improve health. Outcome: Completed/Met Date Met:  12/08/14 INTERVENTION:  RD consulted for nutrition education regarding inpatient bariatric surgery.   RD provided "The Liquid Diet" handout from the Bariatric Surgery Guide from the [facility]. This handout previously provided to patient prior to surgery is a duplicate copy. Discussed what foods/liquids are consistent with a Clear Liquid Diet and reinforced Key Concepts such as no carbonation, no caffeine, or sugar containing beverages. Provided methods to prevent dehydration and promote protein intake, using clock and sample fluid schedule. RD encouraged follow-up with outpatient dietitian after discharge.  Teach back method used.  Expect good compliance.  NUTRITION DIAGNOSIS:  Food and nutrition knowledge related deficit related to recent bariatric surgery as evidenced by dietitian consult for nutrition education   GOAL:  Patient will be able to sip and tolerate CL within 24-48 hours  MONITOR:  Energy intake Digestive system  ASSESSMENT:  Pt s/p revision of sleeve gastrectomy to biliopancreatic diversion duodenal switch yesterday.   Body mass index is 40.05 kg/(m^2).   Current diet order is CL with unjury supplement TID, patient is consuming approximately 1-2oz per 20 mins at this time.   Labs and medications reviewed.   LOW Care Level   , RD, LDN Pager [phone]         

## 2014-12-09 ENCOUNTER — Ambulatory Visit: Payer: Self-pay | Admitting: Obstetrics and Gynecology

## 2014-12-09 LAB — CBC WITH DIFFERENTIAL/PLATELET
Basophils Absolute: 0.1 10*3/uL (ref 0–0.1)
Basophils Relative: 1 %
Eosinophils Absolute: 0.1 10*3/uL (ref 0–0.7)
HCT: 30.5 % — ABNORMAL LOW (ref 35.0–47.0)
Hemoglobin: 9.9 g/dL — ABNORMAL LOW (ref 12.0–16.0)
LYMPHS ABS: 2.2 10*3/uL (ref 1.0–3.6)
MCH: 28.4 pg (ref 26.0–34.0)
MCHC: 32.4 g/dL (ref 32.0–36.0)
MCV: 87.5 fL (ref 80.0–100.0)
MONO ABS: 1.1 10*3/uL — AB (ref 0.2–0.9)
Neutro Abs: 8.9 10*3/uL — ABNORMAL HIGH (ref 1.4–6.5)
Neutrophils Relative %: 71 %
Platelets: 244 10*3/uL (ref 150–400)
RBC: 3.48 MIL/uL — AB (ref 3.80–5.20)
RDW: 15.2 % — ABNORMAL HIGH (ref 11.5–14.5)
WBC: 12.4 10*3/uL — AB (ref 3.6–11.0)

## 2014-12-09 MED ORDER — ONDANSETRON HCL 4 MG PO TABS: 4.0000 mg | ORAL_TABLET | ORAL | Status: DC | PRN

## 2014-12-09 MED ORDER — DIPHENHYDRAMINE HCL 25 MG PO CAPS
25.0000 mg | ORAL_CAPSULE | Freq: Four times a day (QID) | ORAL | Status: DC | PRN
  Administered 2014-12-09: 25 mg via ORAL
  Filled 2014-12-09: qty 1

## 2014-12-09 MED ORDER — DIPHENHYDRAMINE HCL 25 MG PO CAPS
ORAL_CAPSULE | ORAL | Status: AC
  Administered 2014-12-09: 25 mg via ORAL
  Filled 2014-12-09: qty 1

## 2014-12-09 MED ORDER — ENOXAPARIN SODIUM 30 MG/0.3ML ~~LOC~~ SOLN: 30.0000 mg | SUBCUTANEOUS | Status: DC

## 2014-12-09 NOTE — Progress Notes (Signed)
Pt A and O x 4. VSS. Pt tolerating diet well. No complaints of nausea. Complaints of pain with meds given to control. Pt ambulating in hallway independently. Pt given discharge instructions and voiced that she understood with no other questions. Pt given prescriptions and education on emptying Jp drain. Pt left via wheelchair with volunteer.

## 2014-12-09 NOTE — Progress Notes (Signed)
MD on call paged. Oral benadryl ordered for pt complaints and is ok to leave out IV as she will be discharged tomorrow.

## 2014-12-13 NOTE — Anesthesia Postprocedure Evaluation (Signed)
  Anesthesia Post-op Note  Patient: Erika Shelton  Procedure(s) Performed: Procedure(s): LAPAROSCOPIC GASTRIC RESTRICTIVE DUODENAL PROCEDURE (DUODENAL SWITCH) (N/A)  Anesthesia type:General  Patient location: PACU  Post pain: Pain level controlled  Post assessment: Post-op Vital signs reviewed, Patient's Cardiovascular Status Stable, Respiratory Function Stable, Patent Airway and No signs of Nausea or vomiting  Post vital signs: Reviewed and stable  Last Vitals:  Filed Vitals:   12/09/14 0749  BP: 101/52  Pulse: 85  Temp: 36.5 C  Resp: 18    Level of consciousness: awake, alert  and patient cooperative  Complications: No apparent anesthesia complications

## 2014-12-15 ENCOUNTER — Ambulatory Visit (INDEPENDENT_AMBULATORY_CARE_PROVIDER_SITE_OTHER): Payer: 59 | Admitting: Obstetrics and Gynecology

## 2014-12-15 ENCOUNTER — Encounter: Payer: Self-pay | Admitting: Obstetrics and Gynecology

## 2014-12-15 VITALS — BP 121/82 | HR 97 | Ht 63.0 in | Wt 204.2 lb

## 2014-12-15 DIAGNOSIS — Z5181 Encounter for therapeutic drug level monitoring: Secondary | ICD-10-CM | POA: Diagnosis not present

## 2014-12-15 MED ORDER — VITAMIN D (ERGOCALCIFEROL) 1.25 MG (50000 UNIT) PO CAPS: 50000.0000 [IU] | ORAL_CAPSULE | ORAL | Status: DC

## 2014-12-15 MED ORDER — LORAZEPAM 2 MG PO TABS: 2.0000 mg | ORAL_TABLET | Freq: Every day | ORAL | Status: DC

## 2014-12-15 MED ORDER — ZOLPIDEM TARTRATE 10 MG PO TABS: 10.0000 mg | ORAL_TABLET | Freq: Every evening | ORAL | Status: DC | PRN

## 2014-12-15 MED ORDER — VENLAFAXINE HCL ER 37.5 MG PO CP24: 37.5000 mg | ORAL_CAPSULE | Freq: Two times a day (BID) | ORAL | Status: DC

## 2014-12-15 MED ORDER — CONJ ESTROG-MEDROXYPROGEST ACE 0.3-1.5 MG PO TABS: 1.0000 | ORAL_TABLET | Freq: Every day | ORAL | Status: DC

## 2014-12-15 NOTE — Progress Notes (Signed)
Patient ID: Erika Shelton, female   DOB: 1966/03/28, 49 y.o.   MRN: [id number]  Here for medication refills, is moving to [place] next week.  S: denies any concerns at this time  O: A&O x4 Well groomed obese female in no apparent distress.  A: vitamin D deficiency Sleep disturbance Anxiety and depression    P: refill all meds for 90d, rx sent in Desires to continue yearly exams here and will travel here in Sept.   Idalis Hoelting N Burr,CNM

## 2014-12-23 ENCOUNTER — Ambulatory Visit: Payer: 59 | Admitting: Pain Medicine

## 2014-12-24 NOTE — Discharge Summary (Signed)
Physician Discharge Summary Note  Patient:  Erika Shelton is an 49 y.o., female MRN:  [id number] DOB:  Nov 07, 1965 Patient phone:  [phone] (home)  Patient address:   [place] [address],  Total Time spent with patient: 0  Date of Admission:  12/07/2014 Date of Discharge: 12/09/2014  Reason for Admission:  Bariatric surgery  Principal Problem: <principal problem not specified> Discharge Diagnoses: Patient Active Problem List   Diagnosis Date Noted  . Morbid obesity [E66.01] 12/07/2014  . DDD (degenerative disc disease), lumbar [M51.36] 11/23/2014  . Facet syndrome, lumbar [M54.5] 11/23/2014  . 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 Lumbar radiculopathy [M54.16] 11/23/2014      Psychiatric Specialty Exam: Physical Exam  ROS  Blood pressure 101/52, pulse 85, temperature 97.7 F (36.5 C), temperature source Oral, resp. rate 18, height 5\' 2"  (1.575 m), weight 99.338 kg (219 lb), SpO2 95 %.Body mass index is 40.05 kg/(m^2).  General Appearance: n/a  Eye Contact::  n/a  Speech:  N/A  Volume:  N/A  Mood:  N/A  Affect:  N/A  Thought Process:  N/A  Orientation:  Other:  N/A  Thought Content:  N/A  Suicidal Thoughts:  N/A  Homicidal Thoughts:  N/A  Memory:  N/A  Judgement:  Other:  N/A  Insight:  N/A  Psychomotor Activity:  N/A  Concentration:  N/A  Recall:  N/A  Fund of Knowledge:N/A  Language: N/A  Akathisia:  N/A  Handed:  Right  AIMS (if indicated):     Assets:  Others:  N/A  ADL's:  Intact  Cognition: WNL  Sleep:         Has this patient used any form of tobacco in the last 30 days? (Cigarettes, Smokeless Tobacco, Cigars, and/or Pipes) N/A  Past Medical History:  Past Medical History  Diagnosis Date  . Asthma   . Anxiety   . Urinary incontinence   . Complication of anesthesia     nausea    Past Surgical History  Procedure Laterality Date  . Vaginal hysterectomy    . Foot surgery Bilateral   . Laparoscopic gastric  banding    . Laparoscopic gastric sleeve resection    . Bladder surgery      X2   . Interstem     . Laparoscopic gastric restrictive duodenal procedure (duodenal switch) N/A 12/07/2014    Procedure: LAPAROSCOPIC GASTRIC RESTRICTIVE DUODENAL PROCEDURE (DUODENAL SWITCH);  Surgeon: Bonner Puna, MD;  Location: [facility] ORS;  Service: General;  Laterality: N/A;   Family History:  Family History  Problem Relation Age of Onset  . Arthritis Mother   . Asthma Mother   . COPD Mother   . Depression Mother   . Heart disease Mother   . Hyperlipidemia Mother   . Hypertension Mother   . Cancer Father    Social History:  History  Alcohol Use No    Comment: OCCASIONALLY     History  Drug Use No    History   Social History  . Marital Status: Married    Spouse Name: N/A  . Number of Children: N/A  . Years of Education: N/A   Social History Main Topics  . Smoking status: Never Smoker   . Smokeless tobacco: Never Used  . Alcohol Use: No     Comment: OCCASIONALLY  . Drug Use: No  . Sexual Activity: Yes    Birth Control/ Protection: Surgical   Other Topics Concern  . None   Social History Narrative  Past Psychiatric History: Hospitalizations:  Outpatient Care:  Substance Abuse Care:  Self-Mutilation:  Suicidal Attempts:  Violent Behaviors:   Risk to Self: Is patient at risk for suicide?: No Risk to Others:   Prior Inpatient Therapy:   Prior Outpatient Therapy:      Hospital Course:  Pt underwent bariatric surgery and had unremarkable post operative course. By POD2 she was able to tolerate oral intake and ambulate/void without difficulty. The pt denied any chest pain, shortness of breath or generalized weakness and pt was discharged home.   Consults:  None  Significant Diagnostic Studies:  labs: cbc, cmp  Discharge Vitals:   Blood pressure 101/52, pulse 85, temperature 97.7 F (36.5 C), temperature source Oral, resp. rate 18, height 5\' 2"  (1.575 m), weight 99.338 kg  (219 lb), SpO2 95 %. Body mass index is 40.05 kg/(m^2). Lab Results:   No results found for this or any previous visit (from the past 72 hour(s)).  Physical Findings: AIMS:  , ,  ,  ,    CIWA:    COWS:      See Psychiatric Specialty Exam and Suicide Risk Assessment completed by Attending Physician prior to discharge.  Discharge destination:  Home  Is patient on multiple antipsychotic therapies at discharge:  No   Has Patient had three or more failed trials of antipsychotic monotherapy by history:  No    Recommended Plan for Multiple Antipsychotic Therapies: NA  Discharge Instructions    Call MD for:  difficulty breathing, headache or visual disturbances    Complete by:  As directed      Call MD for:  extreme fatigue    Complete by:  As directed      Call MD for:  hives    Complete by:  As directed      Call MD for:  persistant dizziness or light-headedness    Complete by:  As directed      Call MD for:  persistant nausea and vomiting    Complete by:  As directed      Call MD for:  redness, tenderness, or signs of infection (pain, swelling, redness, odor or green/yellow discharge around incision site)    Complete by:  As directed      Call MD for:  severe uncontrolled pain    Complete by:  As directed      Call MD for:  temperature >100.4    Complete by:  As directed      Discharge instructions    Complete by:  As directed   Remember to start your chewable or liquid B complex once you arrive home and take this daily for 30 days. You may continue to take this beyond 30 days if you choose. Stick with a Clear liquid diet for 2 days post operatively then you may advance to a Full Liquid diet for 12 days, which means you will be on a strict liquid diet for 14 days. Your daily goal is going to be to get in 60-80g of protein and 64oz of fluid.     Driving Restrictions    Complete by:  As directed   You may not drive until 24 hours past your last dose of pain medications.      Increase activity slowly    Complete by:  As directed      Lifting restrictions    Complete by:  As directed   Do not lift more than 10-15 pounds for 4-6 weeks post operatively  No dressing needed    Complete by:  As directed      No wound care    Complete by:  As directed      Other Restrictions    Complete by:  As directed   Avoid using your core muscles for 30 days after surgery - motions like pushing, pulling, climbing etc. Make sure to get up and walk frequently every day to help avoid developing blood clots.            Medication List    STOP taking these medications        cefUROXime 250 MG tablet  Commonly known as:  CEFTIN     cephALEXin 500 MG capsule  Commonly known as:  KEFLEX     estrogen (conjugated)-medroxyprogesterone 0.3-1.5 MG per tablet  Commonly known as:  PREMPRO     LORazepam 2 MG tablet  Commonly known as:  ATIVAN     meloxicam 15 MG tablet  Commonly known as:  MOBIC     venlafaxine XR 37.5 MG 24 hr capsule  Commonly known as:  EFFEXOR-XR     Vitamin D (Ergocalciferol) 50000 UNITS Caps capsule  Commonly known as:  DRISDOL     zolpidem 10 MG tablet  Commonly known as:  AMBIEN      TAKE these medications      Indication   enoxaparin 30 MG/0.3ML injection  Commonly known as:  LOVENOX  Inject 0.3 mLs (30 mg total) into the skin daily.      fluconazole 150 MG tablet  Commonly known as:  DIFLUCAN  Limit 1 tablet by mouth times 1 day      meperidine 50 MG tablet  Commonly known as:  DEMEROL  Take one to two capsules every 6 to 8 hours as needed for pain.      oxyCODONE-acetaminophen 10-325 MG per tablet  Commonly known as:  PERCOCET  Take one to two tablets by mouth every six to eight hours as needed for pain.      VITAMIN B-12 IJ  Inject as directed every 30 (thirty) days.           Signed: Mardelle Matte 12/24/2014, 4:56 PM

## 2015-05-03 ENCOUNTER — Encounter: Payer: Self-pay | Admitting: Obstetrics and Gynecology

## 2015-05-09 ENCOUNTER — Telehealth: Payer: Self-pay | Admitting: Obstetrics and Gynecology

## 2015-05-09 NOTE — Telephone Encounter (Signed)
Pt called and was told that you could refill her medications, she lives in [place] now and she was told by you taht you only needed the pharmacy info, it is [facility] number is [phone]. She needs the estrogen not premarin, and also all the other ones, xanax, adavan, Ambien and the RX for depression. She also knows its time for her physical and as soon as she works it out with her job she will come to [place] because she wants you to continue doing her AE, she also said congrats on getting married.

## 2015-05-09 NOTE — Telephone Encounter (Signed)
Pt called back and wanted to get the shingle vaccine also.

## 2015-05-17 ENCOUNTER — Other Ambulatory Visit: Payer: Self-pay | Admitting: Obstetrics and Gynecology

## 2015-05-17 MED ORDER — ALPRAZOLAM 0.5 MG PO TABS: 0.5000 mg | ORAL_TABLET | Freq: Three times a day (TID) | ORAL | Status: DC | PRN

## 2015-05-17 MED ORDER — LORAZEPAM 2 MG PO TABS: 2.0000 mg | ORAL_TABLET | Freq: Every day | ORAL | Status: DC

## 2015-05-17 MED ORDER — ESTRADIOL 0.5 MG PO TABS: 0.5000 mg | ORAL_TABLET | Freq: Every day | ORAL | Status: DC

## 2015-05-17 MED ORDER — ZOLPIDEM TARTRATE 10 MG PO TABS: 10.0000 mg | ORAL_TABLET | Freq: Every evening | ORAL | Status: DC | PRN

## 2015-05-17 MED ORDER — VENLAFAXINE HCL ER 37.5 MG PO CP24: 37.5000 mg | ORAL_CAPSULE | Freq: Two times a day (BID) | ORAL | Status: DC

## 2015-05-17 NOTE — Telephone Encounter (Signed)
Done-ac 

## 2015-05-17 NOTE — Telephone Encounter (Signed)
Please let her know when they are faxed

## 2015-10-13 ENCOUNTER — Other Ambulatory Visit: Payer: Self-pay | Admitting: Obstetrics and Gynecology

## 2015-10-13 DIAGNOSIS — Z1231 Encounter for screening mammogram for malignant neoplasm of breast: Secondary | ICD-10-CM

## 2015-10-17 DIAGNOSIS — E611 Iron deficiency: Secondary | ICD-10-CM | POA: Insufficient documentation

## 2015-11-07 ENCOUNTER — Encounter: Payer: Self-pay | Admitting: Obstetrics and Gynecology

## 2015-11-07 ENCOUNTER — Ambulatory Visit (INDEPENDENT_AMBULATORY_CARE_PROVIDER_SITE_OTHER): Payer: 59 | Admitting: Obstetrics and Gynecology

## 2015-11-07 ENCOUNTER — Other Ambulatory Visit: Payer: Self-pay | Admitting: Obstetrics and Gynecology

## 2015-11-07 ENCOUNTER — Ambulatory Visit
Admission: RE | Admit: 2015-11-07 | Discharge: 2015-11-07 | Disposition: A | Discharge status: Home / Self Care | Payer: Managed Care, Other (non HMO) | Source: Ambulatory Visit | Attending: Obstetrics and Gynecology | Admitting: Obstetrics and Gynecology

## 2015-11-07 VITALS — BP 110/71 | HR 102 | Ht 63.0 in | Wt 199.0 lb

## 2015-11-07 DIAGNOSIS — R5382 Chronic fatigue, unspecified: Secondary | ICD-10-CM | POA: Diagnosis not present

## 2015-11-07 DIAGNOSIS — Z01419 Encounter for gynecological examination (general) (routine) without abnormal findings: Secondary | ICD-10-CM | POA: Diagnosis not present

## 2015-11-07 DIAGNOSIS — Z1231 Encounter for screening mammogram for malignant neoplasm of breast: Secondary | ICD-10-CM | POA: Diagnosis not present

## 2015-11-07 DIAGNOSIS — N941 Unspecified dyspareunia: Secondary | ICD-10-CM | POA: Diagnosis not present

## 2015-11-07 MED ORDER — ZOLPIDEM TARTRATE 10 MG PO TABS: 10.0000 mg | ORAL_TABLET | Freq: Every evening | ORAL | Status: DC | PRN

## 2015-11-07 MED ORDER — PROGESTERONE MICRONIZED 200 MG PO CAPS: 200.0000 mg | ORAL_CAPSULE | Freq: Every day | ORAL | Status: DC

## 2015-11-07 MED ORDER — VENLAFAXINE HCL ER 37.5 MG PO CP24: 37.5000 mg | ORAL_CAPSULE | Freq: Two times a day (BID) | ORAL | Status: DC

## 2015-11-07 MED ORDER — ALPRAZOLAM 0.5 MG PO TABS
0.5000 mg | ORAL_TABLET | Freq: Three times a day (TID) | ORAL | Status: DC | PRN
Start: 2015-11-07 — End: 2018-05-28

## 2015-11-07 MED ORDER — LORAZEPAM 2 MG PO TABS: 2.0000 mg | ORAL_TABLET | Freq: Every day | ORAL | Status: DC

## 2015-11-07 NOTE — Progress Notes (Signed)
Subjective:   Erika Shelton is a 50 y.o. No obstetric history on file. Caucasian female here for a routine well-woman exam.  No LMP recorded. Patient has had a hysterectomy.    Current complaints: fatigue, pain with sex, vaginal dryness PCP: none local       does desire labs  Social History: Sexual: heterosexual Marital Status: married Living situation: with spouse Occupation: unemployed Tobacco/alcohol: no tobacco use Illicit drugs: no history of illicit drug use  The following portions of the patient's history were reviewed and updated as appropriate: allergies, current medications, past family history, past medical history, past social history, past surgical history and problem list.  Past Medical History Past Medical History  Diagnosis Date  . Asthma   . Anxiety   . Urinary incontinence   . Complication of anesthesia     nausea    Past Surgical History Past Surgical History  Procedure Laterality Date  . Vaginal hysterectomy    . Foot surgery Bilateral   . Laparoscopic gastric banding    . Laparoscopic gastric sleeve resection    . Bladder surgery      X2   . Interstem     . Laparoscopic gastric restrictive duodenal procedure (duodenal switch) N/A 12/07/2014    Procedure: LAPAROSCOPIC GASTRIC RESTRICTIVE DUODENAL PROCEDURE (DUODENAL SWITCH);  Surgeon: Bonner Puna, MD;  Location: [facility] ORS;  Service: General;  Laterality: N/A;    Gynecologic History No obstetric history on file.  No LMP recorded. Patient has had a hysterectomy. Contraception: status post hysterectomy Last Pap: 2015. Results were: normal Last mammogram: 2016. Results were: normal   Obstetric History OB History  No data available    Current Medications Current Outpatient Prescriptions on File Prior to Visit  Medication Sig Dispense Refill  . Cyanocobalamin (VITAMIN B-12 IJ) Inject as directed every 30 (thirty) days.    Marland Kitchen estradiol (ESTRACE) 0.5 MG tablet Take 1 tablet (0.5 mg total) by mouth  daily. 90 tablet 2  . Vitamin D, Ergocalciferol, (DRISDOL) 50000 UNITS CAPS capsule Take 1 capsule (50,000 Units total) by mouth every 7 (seven) days. 30 capsule 1  . enoxaparin (LOVENOX) 30 MG/0.3ML injection Inject 0.3 mLs (30 mg total) into the skin daily. (Patient not taking: Reported on 12/15/2014) 14 Syringe 0  . estrogen, conjugated,-medroxyprogesterone (PREMPRO) 0.3-1.5 MG per tablet Take 1 tablet by mouth daily. (Patient not taking: Reported on 11/07/2015) 90 tablet 1  . fluconazole (DIFLUCAN) 150 MG tablet Limit 1 tablet by mouth times 1 day (Patient not taking: Reported on 12/07/2014) 1 tablet 0  . meperidine (DEMEROL) 50 MG tablet Take one to two capsules every 6 to 8 hours as needed for pain. (Patient not taking: Reported on 11/23/2014) 50 tablet 0  . oxyCODONE-acetaminophen (PERCOCET) 10-325 MG per tablet Take one to two tablets by mouth every six to eight hours as needed for pain. (Patient not taking: Reported on 11/23/2014) 50 tablet 0   No current facility-administered medications on file prior to visit.    Review of Systems Patient denies any headaches, blurred vision, shortness of breath, chest pain, abdominal pain, problems with bowel movements, urination, or intercourse.  Objective:  BP 110/71 mmHg  Pulse 102  Ht 5\' 3"  (1.6 m)  Wt 199 lb (90.266 kg)  BMI 35.26 kg/m2 Physical Exam  General:  Well developed, well nourished, no acute distress. She is alert and oriented x3. Skin:  Warm and dry Neck:  Midline trachea, no thyromegaly or nodules Cardiovascular: Regular rate and rhythm, no  murmur heard Lungs:  Effort normal, all lung fields clear to auscultation bilaterally Breasts:  No dominant palpable mass, retraction, or nipple discharge Abdomen:  Soft, non tender, no hepatosplenomegaly or masses Pelvic:  External genitalia is normal in appearance.  The vagina is normal in appearance, although pale and dry. The cervix is bulbous, no CMT.  Thin prep pap is not done  Uterus is  surgically absent.  No adnexal masses or tenderness noted Extremities:  No swelling or varicosities noted Psych:  She has a normal mood and affect  Assessment:   Healthy well-woman exam S/P hysterectomy Obesity Fatigue dysparenia  Plan:  Labs obtained and meds refilled Added prometrium at bedtime F/U 1 year for AE, or sooner if needed Mammogram scheduled today  Melody Rockney Ghee, CNM

## 2015-11-07 NOTE — Patient Instructions (Signed)
Place annual gynecologic exam patient instructions here.

## 2015-11-08 LAB — THYROID PANEL WITH TSH
FREE THYROXINE INDEX: 1.6 (ref 1.2–4.9)
T3 UPTAKE RATIO: 24 % (ref 24–39)
T4 TOTAL: 6.8 ug/dL (ref 4.5–12.0)
TSH: 5.13 u[IU]/mL — AB (ref 0.450–4.500)

## 2015-11-08 LAB — FSH/LH
FSH: 36.8 m[IU]/mL
LH: 40 m[IU]/mL

## 2015-11-08 LAB — COMPREHENSIVE METABOLIC PANEL
A/G RATIO: 1.9 (ref 1.2–2.2)
ALK PHOS: 97 IU/L (ref 39–117)
ALT: 20 IU/L (ref 0–32)
AST: 18 IU/L (ref 0–40)
Albumin: 4.4 g/dL (ref 3.5–5.5)
BUN/Creatinine Ratio: 14 (ref 9–23)
BUN: 10 mg/dL (ref 6–24)
CHLORIDE: 105 mmol/L (ref 96–106)
CO2: 22 mmol/L (ref 18–29)
Calcium: 8.8 mg/dL (ref 8.7–10.2)
Creatinine, Ser: 0.73 mg/dL (ref 0.57–1.00)
GFR calc Af Amer: 111 mL/min/{1.73_m2} (ref >59)
GFR calc non Af Amer: 96 mL/min/{1.73_m2} (ref >59)
GLUCOSE: 89 mg/dL (ref 65–99)
Globulin, Total: 2.3 g/dL (ref 1.5–4.5)
POTASSIUM: 4.1 mmol/L (ref 3.5–5.2)
Sodium: 147 mmol/L — ABNORMAL HIGH (ref 134–144)
TOTAL PROTEIN: 6.7 g/dL (ref 6.0–8.5)

## 2015-11-08 LAB — HEMOGLOBIN A1C
Est. average glucose Bld gHb Est-mCnc: 111 mg/dL
Hgb A1c MFr Bld: 5.5 % (ref 4.8–5.6)

## 2015-11-08 LAB — LIPID PANEL
CHOLESTEROL TOTAL: 179 mg/dL (ref 100–199)
Chol/HDL Ratio: 3.2 ratio units (ref 0.0–4.4)
HDL: 56 mg/dL (ref >39)
LDL Calculated: 86 mg/dL (ref 0–99)
TRIGLYCERIDES: 186 mg/dL — AB (ref 0–149)
VLDL CHOLESTEROL CAL: 37 mg/dL (ref 5–40)

## 2015-11-08 LAB — PROGESTERONE

## 2015-11-08 LAB — DHEA-SULFATE: DHEA SO4: 43.6 ug/dL (ref 41.2–243.7)

## 2015-11-17 ENCOUNTER — Other Ambulatory Visit: Payer: Self-pay | Admitting: Obstetrics and Gynecology

## 2016-01-10 ENCOUNTER — Other Ambulatory Visit: Payer: Self-pay | Admitting: Obstetrics and Gynecology

## 2016-01-10 ENCOUNTER — Other Ambulatory Visit: Payer: Self-pay | Admitting: *Deleted

## 2016-01-10 ENCOUNTER — Telehealth: Payer: Self-pay | Admitting: Obstetrics and Gynecology

## 2016-01-10 MED ORDER — NITROFURANTOIN MONOHYD MACRO 100 MG PO CAPS: 100.0000 mg | ORAL_CAPSULE | Freq: Two times a day (BID) | ORAL | 1 refills | Status: DC

## 2016-01-10 MED ORDER — UROGESIC-BLUE 81.6 MG PO TABS: 1.0000 | ORAL_TABLET | Freq: Four times a day (QID) | ORAL | 0 refills | Status: DC

## 2016-01-10 NOTE — Telephone Encounter (Signed)
pls advise

## 2016-01-10 NOTE — Telephone Encounter (Signed)
Done-sent to [employer]

## 2016-01-10 NOTE — Telephone Encounter (Signed)
PT CALLED AND IS OUT OF TOWN, STAYING WITH HER AUNT THAT HAS CANCER AND SHE HAS A BLADDER INFECTION AND WANTED TO KNOW IF YOU COULD CALL HER IN SOMETHING, SHE CAN NOT COME IN BECAUSE SHE CAN NOT LEAVE HER AUNT, SHE STATED SHE WANTED YOU TO CALL HER BACK.

## 2016-01-10 NOTE — Telephone Encounter (Signed)
rx snt in, please see where she wants it faxed

## 2016-02-08 ENCOUNTER — Other Ambulatory Visit: Payer: Self-pay | Admitting: Obstetrics and Gynecology

## 2016-02-15 ENCOUNTER — Telehealth: Payer: Self-pay | Admitting: Obstetrics and Gynecology

## 2016-02-15 NOTE — Telephone Encounter (Signed)
pls advise

## 2016-02-15 NOTE — Telephone Encounter (Signed)
PT CALLED AND SHE WANTED A FERATIN LEVEL DRAWN AND SHE THINKS SHE IS GETTING UTI, SHE WANTED TO BE SEEN BY [facility] BUT I TOLD HER THAT [facility] NEXT AVAILABLE IS THE END OF SEPTEMBER, SO I DIDN'T KNOW IF THIS COULD BE A NURSE VISIT FOR A URINE DROP OFF AND A LAB VISIT FOR THE [facility]. BUT I TOLD HER I WOULD HAVE TO SEND THIS FIRST TO SEE IF [facility] WOULD ORDER THE FERATIN, SHE STATED SHE IS FEELING BAD AGAIN AND THAT SHE MIGHT BE ANEMIC.

## 2016-02-15 NOTE — Telephone Encounter (Signed)
Nurse visit is fine to urine, please order CBC, B12, iron and Vit D for labs.

## 2016-02-16 NOTE — Telephone Encounter (Signed)
Called pt advised [facility] ok to do ua, and labs, pt will call back needs to discuss with her boss

## 2016-02-17 ENCOUNTER — Other Ambulatory Visit: Payer: Self-pay

## 2016-02-17 ENCOUNTER — Other Ambulatory Visit: Payer: 59

## 2016-02-17 DIAGNOSIS — R399 Unspecified symptoms and signs involving the genitourinary system: Secondary | ICD-10-CM

## 2016-02-17 DIAGNOSIS — R5382 Chronic fatigue, unspecified: Secondary | ICD-10-CM | POA: Diagnosis not present

## 2016-02-18 LAB — CBC WITH DIFFERENTIAL/PLATELET
BASOS: 1 %
Basophils Absolute: 0.1 10*3/uL (ref 0.0–0.2)
EOS (ABSOLUTE): 0.1 10*3/uL (ref 0.0–0.4)
EOS: 2 %
HEMATOCRIT: 41.3 % (ref 34.0–46.6)
HEMOGLOBIN: 13.4 g/dL (ref 11.1–15.9)
IMMATURE GRANS (ABS): 0 10*3/uL (ref 0.0–0.1)
IMMATURE GRANULOCYTES: 0 %
LYMPHS: 26 %
Lymphocytes Absolute: 2.1 10*3/uL (ref 0.7–3.1)
MCH: 29.4 pg (ref 26.6–33.0)
MCHC: 32.4 g/dL (ref 31.5–35.7)
MCV: 91 fL (ref 79–97)
MONOCYTES: 8 %
Monocytes Absolute: 0.7 10*3/uL (ref 0.1–0.9)
NEUTROS PCT: 63 %
Neutrophils Absolute: 5.1 10*3/uL (ref 1.4–7.0)
Platelets: 307 10*3/uL (ref 150–379)
RBC: 4.56 x10E6/uL (ref 3.77–5.28)
RDW: 13.5 % (ref 12.3–15.4)
WBC: 8 10*3/uL (ref 3.4–10.8)

## 2016-02-18 LAB — URINALYSIS, ROUTINE W REFLEX MICROSCOPIC
BILIRUBIN UA: NEGATIVE
Glucose, UA: NEGATIVE
Ketones, UA: NEGATIVE
LEUKOCYTES UA: NEGATIVE
Nitrite, UA: NEGATIVE
PH UA: 6 (ref 5.0–7.5)
PROTEIN UA: NEGATIVE
RBC UA: NEGATIVE
Specific Gravity, UA: 1.018 (ref 1.005–1.030)
Urobilinogen, Ur: 1 mg/dL (ref 0.2–1.0)

## 2016-02-18 LAB — VITAMIN D 25 HYDROXY (VIT D DEFICIENCY, FRACTURES): Vit D, 25-Hydroxy: 30.8 ng/mL (ref 30.0–100.0)

## 2016-02-18 LAB — URINE CULTURE

## 2016-02-18 LAB — B12 AND FOLATE PANEL
FOLATE: 12.6 ng/mL (ref >3.0)
VITAMIN B 12: 525 pg/mL (ref 211–946)

## 2016-02-18 LAB — THYROID PANEL WITH TSH
Free Thyroxine Index: 1.2 (ref 1.2–4.9)
T3 UPTAKE RATIO: 21 % — AB (ref 24–39)
T4 TOTAL: 5.9 ug/dL (ref 4.5–12.0)
TSH: 1.46 u[IU]/mL (ref 0.450–4.500)

## 2016-02-18 LAB — IRON: IRON: 70 ug/dL (ref 27–159)

## 2016-02-20 ENCOUNTER — Other Ambulatory Visit: Payer: Self-pay | Admitting: Obstetrics and Gynecology

## 2016-02-21 ENCOUNTER — Other Ambulatory Visit: Payer: Self-pay | Admitting: *Deleted

## 2016-02-21 ENCOUNTER — Other Ambulatory Visit: Payer: Self-pay | Admitting: Obstetrics and Gynecology

## 2016-02-21 MED ORDER — ESTRADIOL 0.5 MG PO TABS: 0.5000 mg | ORAL_TABLET | Freq: Every day | ORAL | 2 refills | Status: DC

## 2016-02-21 MED ORDER — CONJ ESTROG-MEDROXYPROGEST ACE 0.3-1.5 MG PO TABS: 1.0000 | ORAL_TABLET | Freq: Every day | ORAL | 1 refills | Status: DC

## 2016-02-21 MED ORDER — UROGESIC-BLUE 81.6 MG PO TABS: 1.0000 | ORAL_TABLET | Freq: Four times a day (QID) | ORAL | 0 refills | Status: DC

## 2016-02-21 MED ORDER — LORAZEPAM 2 MG PO TABS: 2.0000 mg | ORAL_TABLET | Freq: Every day | ORAL | 2 refills | Status: DC

## 2016-02-21 MED ORDER — ZOLPIDEM TARTRATE 10 MG PO TABS: 10.0000 mg | ORAL_TABLET | Freq: Every evening | ORAL | 2 refills | Status: DC | PRN

## 2016-02-21 MED ORDER — VENLAFAXINE HCL ER 37.5 MG PO CP24: 37.5000 mg | ORAL_CAPSULE | Freq: Two times a day (BID) | ORAL | 1 refills | Status: DC

## 2016-02-22 ENCOUNTER — Encounter: Payer: Self-pay | Admitting: Obstetrics and Gynecology

## 2016-02-24 ENCOUNTER — Other Ambulatory Visit: Payer: Self-pay | Admitting: Obstetrics and Gynecology

## 2016-02-27 NOTE — Telephone Encounter (Signed)
pls see refill request

## 2016-02-29 ENCOUNTER — Encounter: Payer: Self-pay | Admitting: Emergency Medicine

## 2016-02-29 ENCOUNTER — Emergency Department: Payer: 59

## 2016-02-29 ENCOUNTER — Emergency Department
Admission: EM | Admit: 2016-02-29 | Discharge: 2016-02-29 | Disposition: A | Discharge status: Home / Self Care | Payer: 59 | Attending: Emergency Medicine | Admitting: Emergency Medicine

## 2016-02-29 DIAGNOSIS — R1011 Right upper quadrant pain: Secondary | ICD-10-CM | POA: Insufficient documentation

## 2016-02-29 DIAGNOSIS — J45909 Unspecified asthma, uncomplicated: Secondary | ICD-10-CM | POA: Insufficient documentation

## 2016-02-29 DIAGNOSIS — Z79899 Other long term (current) drug therapy: Secondary | ICD-10-CM | POA: Insufficient documentation

## 2016-02-29 DIAGNOSIS — R109 Unspecified abdominal pain: Secondary | ICD-10-CM

## 2016-02-29 LAB — CBC
HCT: 40.9 % (ref 35.0–47.0)
HEMOGLOBIN: 14.1 g/dL (ref 12.0–16.0)
MCH: 31.3 pg (ref 26.0–34.0)
MCHC: 34.4 g/dL (ref 32.0–36.0)
MCV: 91 fL (ref 80.0–100.0)
Platelets: 265 10*3/uL (ref 150–440)
RBC: 4.5 MIL/uL (ref 3.80–5.20)
RDW: 12.9 % (ref 11.5–14.5)
WBC: 8 10*3/uL (ref 3.6–11.0)

## 2016-02-29 LAB — URINALYSIS COMPLETE WITH MICROSCOPIC (ARMC ONLY)
Bacteria, UA: NONE SEEN
Bilirubin Urine: NEGATIVE
Glucose, UA: NEGATIVE mg/dL
Hgb urine dipstick: NEGATIVE
KETONES UR: NEGATIVE mg/dL
NITRITE: NEGATIVE
PH: 5 (ref 5.0–8.0)
PROTEIN: NEGATIVE mg/dL
SPECIFIC GRAVITY, URINE: 1.023 (ref 1.005–1.030)

## 2016-02-29 LAB — COMPREHENSIVE METABOLIC PANEL
ALK PHOS: 63 U/L (ref 38–126)
ALT: 19 U/L (ref 14–54)
ANION GAP: 10 (ref 5–15)
AST: 23 U/L (ref 15–41)
Albumin: 4.3 g/dL (ref 3.5–5.0)
BILIRUBIN TOTAL: 0.3 mg/dL (ref 0.3–1.2)
BUN: 12 mg/dL (ref 6–20)
CALCIUM: 9.1 mg/dL (ref 8.9–10.3)
CO2: 24 mmol/L (ref 22–32)
Chloride: 106 mmol/L (ref 101–111)
Creatinine, Ser: 0.7 mg/dL (ref 0.44–1.00)
GFR calc non Af Amer: >60 mL/min (ref >60)
Glucose, Bld: 91 mg/dL (ref 65–99)
Potassium: 4.1 mmol/L (ref 3.5–5.1)
SODIUM: 140 mmol/L (ref 135–145)
TOTAL PROTEIN: 7.1 g/dL (ref 6.5–8.1)

## 2016-02-29 LAB — TROPONIN I: Troponin I: <0.03 ng/mL (ref <0.03)

## 2016-02-29 LAB — FIBRIN DERIVATIVES D-DIMER (ARMC ONLY): FIBRIN DERIVATIVES D-DIMER (ARMC): 412 (ref 0–499)

## 2016-02-29 LAB — LIPASE, BLOOD: Lipase: 31 U/L (ref 11–51)

## 2016-02-29 MED ORDER — CARISOPRODOL 350 MG PO TABS: 350.0000 mg | ORAL_TABLET | Freq: Three times a day (TID) | ORAL | 0 refills | Status: DC | PRN

## 2016-02-29 NOTE — ED Triage Notes (Signed)
Pt reports right shoulder pain and RUQ pain x1 week. Pt reports stabbing pain, reports nausea. Pt's last iron infusion was 1 month ago.

## 2016-02-29 NOTE — ED Provider Notes (Signed)
[facility] Emergency Department Provider Note   ____________________________________________   First MD Initiated Contact with Patient 02/29/16 1953     (approximate)  I have reviewed the triage vital signs and the nursing notes.   HISTORY  Chief Complaint Abdominal Pain   HPI Erika Shelton is a 50 y.o. female with a history of a duodenal switch was presenting with right upper quadrant pain over the past week. She says it is intermittent and radiating through to her back. She says it comes on after eating. But it was a muscle spasm but has been referred for workup for gallbladder by multiple physicians. Denies any vomiting. Still has her gallbladder.   Past Medical History:  Diagnosis Date  . Anxiety   . Asthma   . Complication of anesthesia    nausea  . Urinary incontinence     Patient Active Problem List   Diagnosis Date Noted  . Morbid obesity ([place]) 12/07/2014  . DDD (degenerative disc disease), lumbar 11/23/2014  . Facet syndrome, lumbar 11/23/2014  . Sacroiliac joint dysfunction 11/23/2014  . Lumbar radiculopathy 11/23/2014    Past Surgical History:  Procedure Laterality Date  . BLADDER SURGERY     X2   . FOOT SURGERY Bilateral   . interstem     . LAPAROSCOPIC GASTRIC BANDING    . LAPAROSCOPIC GASTRIC RESTRICTIVE DUODENAL PROCEDURE (DUODENAL SWITCH) N/A 12/07/2014   Procedure: LAPAROSCOPIC GASTRIC RESTRICTIVE DUODENAL PROCEDURE (DUODENAL SWITCH);  Surgeon: Bonner Puna, MD;  Location: [facility] ORS;  Service: General;  Laterality: N/A;  . LAPAROSCOPIC GASTRIC SLEEVE RESECTION    . VAGINAL HYSTERECTOMY      Prior to Admission medications   Medication Sig Start Date End Date Taking? Authorizing Provider  ALPRAZolam Duanne Moron) 0.5 MG tablet Take 1 tablet (0.5 mg total) by mouth 3 (three) times daily as needed for anxiety. 11/07/15   Melody N Shambley, CNM  Cyanocobalamin (VITAMIN B-12 IJ) Inject as directed every 30 (thirty) days.     Historical Provider, MD  enoxaparin (LOVENOX) 30 MG/0.3ML injection Inject 0.3 mLs (30 mg total) into the skin daily. Patient not taking: Reported on 12/15/2014 12/09/14 12/21/14  Mardelle Matte, PA-C  estradiol (ESTRACE) 0.5 MG tablet Take 1 tablet (0.5 mg total) by mouth daily. 02/21/16   Melody N Shambley, CNM  estrogen, conjugated,-medroxyprogesterone (PREMPRO) 0.3-1.5 MG tablet Take 1 tablet by mouth daily. 02/21/16   Melody N Shambley, CNM  fluconazole (DIFLUCAN) 150 MG tablet Limit 1 tablet by mouth times 1 day Patient not taking: Reported on 12/07/2014 11/23/14   Mohammed Kindle, MD  LORazepam (ATIVAN) 2 MG tablet Take 1 tablet (2 mg total) by mouth daily. 02/21/16   Melody N Shambley, CNM  meperidine (DEMEROL) 50 MG tablet Take one to two capsules every 6 to 8 hours as needed for pain. Patient not taking: Reported on 11/23/2014 04/06/13   Max T Hyatt, DPM  Methen-Hyosc-Meth Blue-Na Phos (UROGESIC-BLUE) 81.6 MG TABS Take 1 tablet (81.6 mg total) by mouth 4 (four) times daily. 02/21/16   Melody N Shambley, CNM  nitrofurantoin, macrocrystal-monohydrate, (MACROBID) 100 MG capsule Take 1 capsule (100 mg total) by mouth 2 (two) times daily. 01/10/16   Melody N Shambley, CNM  oxyCODONE-acetaminophen (PERCOCET) 10-325 MG per tablet Take one to two tablets by mouth every six to eight hours as needed for pain. Patient not taking: Reported on 11/23/2014 04/02/13   Max T Hyatt, DPM  progesterone (PROMETRIUM) 200 MG capsule Take 1 capsule (200 mg total)  by mouth daily. 11/07/15   Melody N Shambley, CNM  venlafaxine XR (EFFEXOR-XR) 37.5 MG 24 hr capsule Take 1 capsule (37.5 mg total) by mouth 2 (two) times daily. 02/21/16   Melody N Shambley, CNM  Vitamin D, Ergocalciferol, (DRISDOL) 50000 UNITS CAPS capsule Take 1 capsule (50,000 Units total) by mouth every 7 (seven) days. 12/15/14   Melody N Shambley, CNM  zolpidem (AMBIEN) 10 MG tablet TAKE 1 TABLET BY MOUTH AT BEDTIME 02/28/16   Melody N Shambley, CNM     Allergies Ciprofloxacin; Darvocet [propoxyphene n-acetaminophen]; Hydrocodone; Percocet [oxycodone-acetaminophen]; and Vicodin [hydrocodone-acetaminophen]  Family History  Problem Relation Age of Onset  . Arthritis Mother   . Asthma Mother   . COPD Mother   . Depression Mother   . Heart disease Mother   . Hyperlipidemia Mother   . Hypertension Mother   . Cancer Father   . Breast cancer Maternal Aunt 74    Social History Social History  Substance Use Topics  . Smoking status: Never Smoker  . Smokeless tobacco: Never Used  . Alcohol use No     Comment: OCCASIONALLY    Review of Systems Constitutional: No fever/chills Eyes: No visual changes. ENT: No sore throat. Cardiovascular: Denies chest pain. Respiratory: Denies shortness of breath. Gastrointestinal:  no vomiting.  No diarrhea.  No constipation. Genitourinary: Negative for dysuria. Musculoskeletal: Negative for back pain. Skin: Negative for rash. Neurological: Negative for headaches, focal weakness or numbness.  10-point ROS otherwise negative.  ____________________________________________   PHYSICAL EXAM:  VITAL SIGNS: ED Triage Vitals [02/29/16 1832]  Enc Vitals Group     BP 112/78     Pulse Rate 73     Resp 16     Temp 98.1 F (36.7 C)     Temp Source Oral     SpO2 98 %     Weight 206 lb (93.4 kg)     Height 5\' 3"  (1.6 m)     Head Circumference      Peak Flow      Pain Score 8     Pain Loc      Pain Edu?      Excl. in [place]?     Constitutional: Alert and oriented. Well appearing and in no acute distress. Eyes: Conjunctivae are normal. PERRL. EOMI. Head: Atraumatic. Nose: No congestion/rhinnorhea. Mouth/Throat: Mucous membranes are moist.   Neck: No stridor.   Cardiovascular: Normal rate, regular rhythm. Grossly normal heart sounds.   Respiratory: Normal respiratory effort.  No retractions. Lungs CTAB. Gastrointestinal: Soft and nontender. Negative Murphy sign. No distention.   Musculoskeletal: No lower extremity tenderness nor edema.  No joint effusions. Neurologic:  Normal speech and language. No gross focal neurologic deficits are appreciated. No gait instability. Skin:  Skin is warm, dry and intact. No rash noted. Psychiatric: Mood and affect are normal. Speech and behavior are normal.  ____________________________________________   LABS (all labs ordered are listed, but only abnormal results are displayed)  Labs Reviewed  URINALYSIS COMPLETEWITH MICROSCOPIC ([place]) - Abnormal; Notable for the following:       Result Value   Color, Urine YELLOW (*)    APPearance CLEAR (*)    Leukocytes, UA TRACE (*)    Squamous Epithelial / LPF 0-5 (*)    All other components within normal limits  URINE CULTURE  LIPASE, BLOOD  COMPREHENSIVE METABOLIC PANEL  CBC  FIBRIN DERIVATIVES D-DIMER ([facility] ONLY)  TROPONIN I   ____________________________________________  EKG   ____________________________________________  RADIOLOGY  DG Chest 2 View (Accession [id number]) [id number] [id number])  Imaging  Date: 02/29/2016 Department: [facility] EMERGENCY DEPARTMENT Released By/Authorizing: Orbie Pyo, MD (auto-released)  PACS Images   Show images for DG Chest 2 View  Study Result   CLINICAL DATA:  Right upper quadrant abdominal pain for 1 week. Right shoulder pain. Nausea.  EXAM: CHEST  2 VIEW  COMPARISON:  07/27/2014  FINDINGS: The lungs are clear. The pulmonary vasculature is normal. Heart size is normal. Hilar and mediastinal contours are unremarkable. There is no pleural effusion.  IMPRESSION: No active cardiopulmonary disease.   Electronically Signed   By: Andreas Newport M.D.   On: 02/29/2016 22:17     ____________________________________________   PROCEDURES  Procedure(s) performed:   Procedures  Critical Care performed:   ____________________________________________   INITIAL IMPRESSION  / ASSESSMENT AND PLAN / ED COURSE  Pertinent labs & imaging results that were available during my care of the patient were reviewed by me and considered in my medical decision making (see chart for details).  ----------------------------------------- 10:59 PM on 02/29/2016 -----------------------------------------  Patient resting comfortably at this time. Very reassuring workup including negative gallbladder ultrasound, negative d-dimer. Reassuring EKG and chest x-ray. It is possible that the etiology is in fact a muscle spasm. We'll prescribe Soma. Also discussed using a salve such as Aspercreme or icy hot. Will be discharged home.  Clinical Course     ____________________________________________   FINAL CLINICAL IMPRESSION(S) / ED DIAGNOSES  Final diagnoses:  RUQ abdominal pain   Right-sided flank pain.   NEW MEDICATIONS STARTED DURING THIS VISIT:  New Prescriptions   No medications on file     Note:  This document was prepared using Dragon voice recognition software and may include unintentional dictation errors.    Orbie Pyo, MD 02/29/16 [phone]

## 2016-02-29 NOTE — ED Notes (Signed)
Called lab to make sure they had the tubes to add on D-dimer and troponin. They stated they did and could run the test.

## 2016-02-29 NOTE — ED Notes (Signed)
Discharge instructions reviewed with patient. Patient verbalized understanding. Patient ambulated to lobby without difficulty.   

## 2016-03-02 LAB — URINE CULTURE

## 2016-04-09 ENCOUNTER — Other Ambulatory Visit: Payer: Self-pay | Admitting: Obstetrics and Gynecology

## 2016-04-09 ENCOUNTER — Encounter: Payer: Self-pay | Admitting: Obstetrics and Gynecology

## 2016-04-13 MED FILL — VENLAFAXINE HCL ER 37.5 MG: 37.5 | 30 days supply | Qty: 60 | Fill #0

## 2016-04-13 MED FILL — ZOLPIDEM TARTRATE 10 MG TAB: 10 | 30 days supply | Qty: 30 | Fill #0

## 2016-04-13 MED FILL — VIT D2 1.25 MG (50,000 UNIT: 1.25 MG | 84 days supply | Qty: 12 | Fill #0

## 2016-04-13 MED FILL — LORazepam 2 MG TABS: 2 | 30 days supply | Qty: 30 | Fill #0

## 2016-04-18 ENCOUNTER — Encounter: Payer: Self-pay | Admitting: Obstetrics and Gynecology

## 2016-04-18 ENCOUNTER — Other Ambulatory Visit: Payer: Self-pay | Admitting: Obstetrics and Gynecology

## 2016-04-18 MED ORDER — CYANOCOBALAMIN 1000 MCG/ML IJ SOLN: 1000.0000 ug | INTRAMUSCULAR | 1 refills | Status: DC

## 2016-04-18 MED ORDER — PROGESTERONE MICRONIZED 200 MG PO CAPS: 200.0000 mg | ORAL_CAPSULE | Freq: Every day | ORAL | 4 refills | Status: DC

## 2016-04-18 MED ORDER — LEVOTHYROXINE SODIUM 50 MCG PO TABS: 50.0000 ug | ORAL_TABLET | Freq: Every day | ORAL | 3 refills | Status: DC

## 2016-04-18 MED ORDER — CARISOPRODOL 350 MG PO TABS: 350.0000 mg | ORAL_TABLET | Freq: Three times a day (TID) | ORAL | 2 refills | Status: DC | PRN

## 2016-04-18 MED FILL — LEVOTHYROXINE 50 MCG TABLET: 50 | 90 days supply | Qty: 90 | Fill #0 | Status: TO

## 2016-04-19 MED FILL — CARISOPRODOL 350 MG TABLET: 350 | 30 days supply | Qty: 90 | Fill #0

## 2016-04-19 MED FILL — CYANOCOBALAMIN 1,000 MCG/ML: 1000 | 90 days supply | Qty: 3 | Fill #0

## 2016-04-19 MED FILL — PROGESTERONE 200 MG CAPSULE: 200 | 90 days supply | Qty: 90 | Fill #0 | Status: TO

## 2016-05-09 ENCOUNTER — Emergency Department: Payer: 59

## 2016-05-09 ENCOUNTER — Emergency Department
Admission: EM | Admit: 2016-05-09 | Discharge: 2016-05-09 | Disposition: A | Discharge status: Home / Self Care | Payer: 59 | Attending: Emergency Medicine | Admitting: Emergency Medicine

## 2016-05-09 ENCOUNTER — Encounter: Payer: Self-pay | Admitting: Emergency Medicine

## 2016-05-09 DIAGNOSIS — M5441 Lumbago with sciatica, right side: Secondary | ICD-10-CM | POA: Insufficient documentation

## 2016-05-09 DIAGNOSIS — Z79899 Other long term (current) drug therapy: Secondary | ICD-10-CM | POA: Diagnosis not present

## 2016-05-09 DIAGNOSIS — Y92009 Unspecified place in unspecified non-institutional (private) residence as the place of occurrence of the external cause: Secondary | ICD-10-CM | POA: Insufficient documentation

## 2016-05-09 DIAGNOSIS — S300XXA Contusion of lower back and pelvis, initial encounter: Secondary | ICD-10-CM | POA: Insufficient documentation

## 2016-05-09 DIAGNOSIS — M549 Dorsalgia, unspecified: Secondary | ICD-10-CM

## 2016-05-09 DIAGNOSIS — Z7901 Long term (current) use of anticoagulants: Secondary | ICD-10-CM | POA: Insufficient documentation

## 2016-05-09 DIAGNOSIS — S3992XA Unspecified injury of lower back, initial encounter: Secondary | ICD-10-CM | POA: Diagnosis not present

## 2016-05-09 DIAGNOSIS — M5431 Sciatica, right side: Secondary | ICD-10-CM

## 2016-05-09 DIAGNOSIS — Y939 Activity, unspecified: Secondary | ICD-10-CM | POA: Diagnosis not present

## 2016-05-09 DIAGNOSIS — M533 Sacrococcygeal disorders, not elsewhere classified: Secondary | ICD-10-CM | POA: Diagnosis not present

## 2016-05-09 DIAGNOSIS — W19XXXA Unspecified fall, initial encounter: Secondary | ICD-10-CM

## 2016-05-09 DIAGNOSIS — M5442 Lumbago with sciatica, left side: Secondary | ICD-10-CM | POA: Insufficient documentation

## 2016-05-09 DIAGNOSIS — Y999 Unspecified external cause status: Secondary | ICD-10-CM | POA: Insufficient documentation

## 2016-05-09 DIAGNOSIS — W010XXA Fall on same level from slipping, tripping and stumbling without subsequent striking against object, initial encounter: Secondary | ICD-10-CM | POA: Insufficient documentation

## 2016-05-09 DIAGNOSIS — J45909 Unspecified asthma, uncomplicated: Secondary | ICD-10-CM | POA: Insufficient documentation

## 2016-05-09 DIAGNOSIS — M5432 Sciatica, left side: Secondary | ICD-10-CM

## 2016-05-09 MED ORDER — MELOXICAM 15 MG PO TABS: 15.0000 mg | ORAL_TABLET | Freq: Every day | ORAL | 0 refills | Status: DC

## 2016-05-09 MED ORDER — METHOCARBAMOL 500 MG PO TABS: 500.0000 mg | ORAL_TABLET | Freq: Four times a day (QID) | ORAL | 0 refills | Status: DC

## 2016-05-09 NOTE — ED Provider Notes (Signed)
[facility] Emergency Department Provider Note  ____________________________________________  Time seen: Approximately 6:00 PM  I have reviewed the triage vital signs and the nursing notes.   HISTORY  Chief Complaint Back Pain    HPI Erika Shelton is a 50 y.o. female who presents emergency department complaining of low back pain 2 weeks. Patient states that she slipped and fell on a hardwood floor has been having lower back pain since this occurrence. Patient states that she was seen a chiropractor but they did not take any x-rays. She states that after the ingestants, instead of helping they have caused increased lower back pain with radicular symptoms. She denies any bowel or bladder dysfunction, saddle anesthesia, paresthesias. Patient has tried ram use of over-the-counter medications with minimal relief of symptoms. She denies hitting her head or losing consciousness during time of fall. No complaint at this time. Patient does have chronic lower back issues with neurostimulator in place.   Past Medical History:  Diagnosis Date  . Anxiety   . Asthma   . Complication of anesthesia    nausea  . Urinary incontinence     Patient Active Problem List   Diagnosis Date Noted  . Morbid obesity ([place]) 12/07/2014  . DDD (degenerative disc disease), lumbar 11/23/2014  . Facet syndrome, lumbar 11/23/2014  . Sacroiliac joint dysfunction 11/23/2014  . Lumbar radiculopathy 11/23/2014    Past Surgical History:  Procedure Laterality Date  . BLADDER SURGERY     X2   . FOOT SURGERY Bilateral   . interstem     . LAPAROSCOPIC GASTRIC BANDING    . LAPAROSCOPIC GASTRIC RESTRICTIVE DUODENAL PROCEDURE (DUODENAL SWITCH) N/A 12/07/2014   Procedure: LAPAROSCOPIC GASTRIC RESTRICTIVE DUODENAL PROCEDURE (DUODENAL SWITCH);  Surgeon: Bonner Puna, MD;  Location: [facility] ORS;  Service: General;  Laterality: N/A;  . LAPAROSCOPIC GASTRIC SLEEVE RESECTION    . VAGINAL HYSTERECTOMY       Prior to Admission medications   Medication Sig Start Date End Date Taking? Authorizing Provider  ALPRAZolam Duanne Moron) 0.5 MG tablet Take 1 tablet (0.5 mg total) by mouth 3 (three) times daily as needed for anxiety. 11/07/15   Melody N Shambley, CNM  carisoprodol (SOMA) 350 MG tablet Take 1 tablet (350 mg total) by mouth 3 (three) times daily as needed for muscle spasms. 04/18/16 04/18/17  Melody N Shambley, CNM  cyanocobalamin (,VITAMIN B-12,) 1000 MCG/ML injection Inject 1 mL (1,000 mcg total) into the muscle every 30 (thirty) days. 04/18/16   Melody N Shambley, CNM  Cyanocobalamin (VITAMIN B-12 IJ) Inject as directed every 30 (thirty) days.    Historical Provider, MD  enoxaparin (LOVENOX) 30 MG/0.3ML injection Inject 0.3 mLs (30 mg total) into the skin daily. Patient not taking: Reported on 12/15/2014 12/09/14 12/21/14  Mardelle Matte, PA-C  estradiol (ESTRACE) 0.5 MG tablet Take 1 tablet (0.5 mg total) by mouth daily. 02/21/16   Melody N Shambley, CNM  estrogen, conjugated,-medroxyprogesterone (PREMPRO) 0.3-1.5 MG tablet Take 1 tablet by mouth daily. 02/21/16   Melody N Shambley, CNM  fluconazole (DIFLUCAN) 150 MG tablet Limit 1 tablet by mouth times 1 day Patient not taking: Reported on 12/07/2014 11/23/14   Mohammed Kindle, MD  levothyroxine (SYNTHROID) 50 MCG tablet Take 1 tablet (50 mcg total) by mouth daily before breakfast. 04/18/16   Melody N Shambley, CNM  LORazepam (ATIVAN) 2 MG tablet Take 1 tablet (2 mg total) by mouth daily. 02/21/16   Melody N Shambley, CNM  meloxicam (MOBIC) 15 MG tablet Take  1 tablet (15 mg total) by mouth daily. 05/09/16   Charline Bills Cuthriell, PA-C  meperidine (DEMEROL) 50 MG tablet Take one to two capsules every 6 to 8 hours as needed for pain. Patient not taking: Reported on 11/23/2014 04/06/13   Max T Hyatt, DPM  Methen-Hyosc-Meth Blue-Na Phos (UROGESIC-BLUE) 81.6 MG TABS Take 1 tablet (81.6 mg total) by mouth 4 (four) times daily. 02/21/16   Melody N Shambley, CNM   methocarbamol (ROBAXIN) 500 MG tablet Take 1 tablet (500 mg total) by mouth 4 (four) times daily. 05/09/16   Charline Bills Cuthriell, PA-C  nitrofurantoin, macrocrystal-monohydrate, (MACROBID) 100 MG capsule Take 1 capsule (100 mg total) by mouth 2 (two) times daily. 01/10/16   Melody N Shambley, CNM  oxyCODONE-acetaminophen (PERCOCET) 10-325 MG per tablet Take one to two tablets by mouth every six to eight hours as needed for pain. Patient not taking: Reported on 11/23/2014 04/02/13   Max T Hyatt, DPM  progesterone (PROMETRIUM) 200 MG capsule Take 1 capsule (200 mg total) by mouth daily. 04/18/16   Melody N Shambley, CNM  venlafaxine XR (EFFEXOR-XR) 37.5 MG 24 hr capsule Take 1 capsule (37.5 mg total) by mouth 2 (two) times daily. 02/21/16   Melody N Shambley, CNM  Vitamin D, Ergocalciferol, (DRISDOL) 50000 UNITS CAPS capsule Take 1 capsule (50,000 Units total) by mouth every 7 (seven) days. 12/15/14   Melody N Shambley, CNM  zolpidem (AMBIEN) 10 MG tablet TAKE 1 TABLET BY MOUTH AT BEDTIME 02/28/16   Melody N Shambley, CNM    Allergies Ciprofloxacin; Darvocet [propoxyphene n-acetaminophen]; Hydrocodone; Percocet [oxycodone-acetaminophen]; and Vicodin [hydrocodone-acetaminophen]  Family History  Problem Relation Age of Onset  . Arthritis Mother   . Asthma Mother   . COPD Mother   . Depression Mother   . Heart disease Mother   . Hyperlipidemia Mother   . Hypertension Mother   . Cancer Father   . Breast cancer Maternal Aunt 74    Social History Social History  Substance Use Topics  . Smoking status: Never Smoker  . Smokeless tobacco: Never Used  . Alcohol use No     Comment: OCCASIONALLY     Review of Systems  Constitutional: No fever/chills Cardiovascular: no chest pain. Respiratory: no cough. No SOB. Gastrointestinal: No abdominal pain.  No nausea, no vomiting. Genitourinary: Negative for dysuria. No hematuria Musculoskeletal: Positive for lower back pain with numbness and tingling  down bilateral lower extremities. Skin: Negative for rash, abrasions, lacerations, ecchymosis. Neurological: Negative for headaches, focal weakness or numbness. 10-point ROS otherwise negative.  ____________________________________________   PHYSICAL EXAM:  VITAL SIGNS: ED Triage Vitals  Enc Vitals Group     BP 05/09/16 1753 120/76     Pulse Rate 05/09/16 1753 71     Resp 05/09/16 1753 18     Temp 05/09/16 1752 98.2 F (36.8 C)     Temp Source 05/09/16 1752 Oral     SpO2 05/09/16 1753 100 %     Weight 05/09/16 1751 206 lb (93.4 kg)     Height 05/09/16 1751 5\' 3"  (1.6 m)     Head Circumference --      Peak Flow --      Pain Score 05/09/16 1751 8     Pain Loc --      Pain Edu? --      Excl. in [place]? --      Constitutional: Alert and oriented. Well appearing and in no acute distress. Eyes: Conjunctivae are normal. PERRL. EOMI. Head: Atraumatic.  Neck: No stridor.    Cardiovascular: Normal rate, regular rhythm. Normal S1 and S2.  Good peripheral circulation. Respiratory: Normal respiratory effort without tachypnea or retractions. Lungs CTAB. Good air entry to the bases with no decreased or absent breath sounds. Gastrointestinal: Bowel sounds 4 quadrants. Soft and nontender to palpation. No guarding or rigidity. No palpable masses. No distention. No CVA tenderness. Musculoskeletal: Full range of motion to all extremities. No gross deformities appreciated. No visible deformities to spine but inspection. Patient is tender to palpation midline in the L5-S1 region. No step-off or palpable abnormality. Patient is diffusely tender to palpation bilateral paraspinal muscle groups in the lumbar sacral region. No point tenderness. Patient is moderately tender to palpation over bilateral sciatic notches. Negative straight leg raise bilaterally. Dorsalis pedis pulse intact and equal lower extremities. Sensation intact and equal lower extremities. Neurologic:  Normal speech and language. No gross  focal neurologic deficits are appreciated.  Skin:  Skin is warm, dry and intact. No rash noted. Psychiatric: Mood and affect are normal. Speech and behavior are normal. Patient exhibits appropriate insight and judgement.   ____________________________________________   LABS (all labs ordered are listed, but only abnormal results are displayed)  Labs Reviewed - No data to display ____________________________________________  EKG   ____________________________________________  RADIOLOGY Diamantina Providence Cuthriell, personally viewed and evaluated these images (plain radiographs) as part of my medical decision making, as well as reviewing the written report by the radiologist.  Dg Lumbar Spine Complete  Result Date: 05/09/2016 CLINICAL DATA:  Sacrum and coccyx pain and radicular symptoms since a fall at home 2 weeks ago. EXAM: LUMBAR SPINE - COMPLETE 4+ VIEW COMPARISON:  CT scan of the lumbar spine dated 09/29/2014 and CT scan of the abdomen and pelvis dated dated 05/17/2011 FINDINGS: Neurostimulator in place in the pelvis of. There is no fracture or subluxation or facet arthritis. Slight narrowing of the L5-S1 disc space. IMPRESSION: No acute abnormality.  Slight degenerative disc disease at L5-S1. Electronically Signed   By: Lorriane Shire M.D.   On: 05/09/2016 18:57   Dg Sacrum/coccyx  Result Date: 05/09/2016 CLINICAL DATA:  Sacrum and coccyx pain since a fall at home 2 weeks ago. EXAM: SACRUM AND COCCYX - 2+ VIEW COMPARISON:  CT scan of the abdomen and pelvis dated 05/17/2011 FINDINGS: Pelvic neurostimulator in place. No fracture or other abnormality of the sacrum or coccyx. IMPRESSION: Normal exam. Electronically Signed   By: Lorriane Shire M.D.   On: 05/09/2016 18:58    ____________________________________________    PROCEDURES  Procedure(s) performed:    Procedures    Medications - No data to display   ____________________________________________   INITIAL  IMPRESSION / ASSESSMENT AND PLAN / ED COURSE  Pertinent labs & imaging results that were available during my care of the patient were reviewed by me and considered in my medical decision making (see chart for details).  Review of the [place] CSRS was performed in accordance of the [place] prior to dispensing any controlled drugs.  Clinical Course     Patient's diagnosis is consistent with Fall resulting in lumbar sacral contusion with sciatica. X-rays were reassuring with no acute osseous abnormality. Patient's increasing symptoms are likely due to just her chiropractor. Patient is advised to use medications prescribed.. Patient will be discharged home with prescriptions for anti-inflammatory muscle relaxer. Patient is to follow up with orthopedics as needed or otherwise directed. Patient is given ED precautions to return to the ED for any worsening or new symptoms.  ____________________________________________  FINAL CLINICAL IMPRESSION(S) / ED DIAGNOSES  Final diagnoses:  Back pain  Fall, initial encounter  Bilateral sciatica      NEW MEDICATIONS STARTED DURING THIS VISIT:  Discharge Medication List as of 05/09/2016  7:19 PM    START taking these medications   Details  meloxicam (MOBIC) 15 MG tablet Take 1 tablet (15 mg total) by mouth daily., Starting Wed 05/09/2016, Print    methocarbamol (ROBAXIN) 500 MG tablet Take 1 tablet (500 mg total) by mouth 4 (four) times daily., Starting Wed 05/09/2016, Print            This chart was dictated using voice recognition software/Dragon. Despite best efforts to proofread, errors can occur which can change the meaning. Any change was purely unintentional.    Darletta Moll, PA-C 05/09/16 1933    Nance Pear, MD 05/09/16 2031

## 2016-05-09 NOTE — ED Triage Notes (Signed)
Pt slipped on wood floor 2 weeks ago. C/o lower back pain that radiates down legs. Here to get xray and make sure nothing broken.  Has not been seen yet. Ambulatory to triage without difficulty.

## 2016-05-09 NOTE — ED Triage Notes (Signed)
No loss bowel or bladder

## 2016-05-09 NOTE — ED Notes (Addendum)
Pt presents with back pain x 2 weeks. She fell in the night and has been seeing a chiropractor, who told her she needs xrays. She has pain and numbness in her back and both legs. NAD noted.

## 2016-06-05 DIAGNOSIS — M533 Sacrococcygeal disorders, not elsewhere classified: Secondary | ICD-10-CM | POA: Diagnosis not present

## 2016-06-12 MED FILL — PRENATAL TABLET: 28-0.8 | 50 days supply | Qty: 100 | Fill #0

## 2016-06-12 MED FILL — VENLAFAXINE HCL ER 37.5 MG: 37.5 | 30 days supply | Qty: 60 | Fill #1 | Status: TO

## 2016-06-12 MED FILL — ZOLPIDEM TARTRATE 10 MG TAB: 10 | 30 days supply | Qty: 30 | Fill #1

## 2016-06-12 MED FILL — LORazepam 2 MG TABS: 2 | 30 days supply | Qty: 30 | Fill #1

## 2016-06-26 ENCOUNTER — Other Ambulatory Visit (HOSPITAL_COMMUNITY): Payer: Self-pay | Admitting: Neurosurgery

## 2016-06-26 DIAGNOSIS — M533 Sacrococcygeal disorders, not elsewhere classified: Secondary | ICD-10-CM

## 2016-07-02 ENCOUNTER — Ambulatory Visit: Payer: 59

## 2016-07-02 ENCOUNTER — Ambulatory Visit: Admission: RE | Admit: 2016-07-02 | Payer: 59 | Source: Ambulatory Visit

## 2016-07-03 ENCOUNTER — Other Ambulatory Visit: Payer: Self-pay | Admitting: Obstetrics and Gynecology

## 2016-07-03 ENCOUNTER — Ambulatory Visit: Admission: RE | Admit: 2016-07-03 | Payer: 59 | Source: Ambulatory Visit

## 2016-07-03 ENCOUNTER — Ambulatory Visit
Admission: RE | Admit: 2016-07-03 | Discharge: 2016-07-03 | Disposition: A | Discharge status: Home / Self Care | Payer: 59 | Source: Ambulatory Visit | Attending: Neurosurgery | Admitting: Neurosurgery

## 2016-07-03 DIAGNOSIS — M5126 Other intervertebral disc displacement, lumbar region: Secondary | ICD-10-CM | POA: Diagnosis not present

## 2016-07-03 DIAGNOSIS — M533 Sacrococcygeal disorders, not elsewhere classified: Secondary | ICD-10-CM | POA: Diagnosis not present

## 2016-07-03 DIAGNOSIS — R102 Pelvic and perineal pain: Secondary | ICD-10-CM | POA: Diagnosis not present

## 2016-07-09 DIAGNOSIS — N3942 Incontinence without sensory awareness: Secondary | ICD-10-CM | POA: Diagnosis not present

## 2016-07-09 DIAGNOSIS — R351 Nocturia: Secondary | ICD-10-CM | POA: Diagnosis not present

## 2016-07-09 DIAGNOSIS — R350 Frequency of micturition: Secondary | ICD-10-CM | POA: Diagnosis not present

## 2016-07-09 DIAGNOSIS — M25562 Pain in left knee: Secondary | ICD-10-CM | POA: Diagnosis not present

## 2016-07-10 DIAGNOSIS — M5137 Other intervertebral disc degeneration, lumbosacral region: Secondary | ICD-10-CM | POA: Diagnosis not present

## 2016-07-19 ENCOUNTER — Other Ambulatory Visit: Payer: Self-pay | Admitting: Neurosurgery

## 2016-07-19 DIAGNOSIS — M5137 Other intervertebral disc degeneration, lumbosacral region: Secondary | ICD-10-CM

## 2016-07-19 DIAGNOSIS — M25562 Pain in left knee: Secondary | ICD-10-CM | POA: Diagnosis not present

## 2016-07-23 ENCOUNTER — Ambulatory Visit
Admission: RE | Admit: 2016-07-23 | Discharge: 2016-07-23 | Disposition: A | Discharge status: Home / Self Care | Payer: 59 | Source: Ambulatory Visit | Attending: Neurosurgery | Admitting: Neurosurgery

## 2016-07-23 VITALS — BP 102/66 | HR 66

## 2016-07-23 DIAGNOSIS — M47816 Spondylosis without myelopathy or radiculopathy, lumbar region: Secondary | ICD-10-CM

## 2016-07-23 DIAGNOSIS — M5137 Other intervertebral disc degeneration, lumbosacral region: Secondary | ICD-10-CM

## 2016-07-23 DIAGNOSIS — M5416 Radiculopathy, lumbar region: Secondary | ICD-10-CM

## 2016-07-23 DIAGNOSIS — M5126 Other intervertebral disc displacement, lumbar region: Secondary | ICD-10-CM | POA: Diagnosis not present

## 2016-07-23 MED ORDER — IOPAMIDOL (ISOVUE-M 200) INJECTION 41%
15.0000 mL | Freq: Once | INTRAMUSCULAR | Status: AC
  Administered 2016-07-23: 15 mL via INTRATHECAL

## 2016-07-23 MED ORDER — DIAZEPAM 5 MG PO TABS
10.0000 mg | ORAL_TABLET | Freq: Once | ORAL | Status: AC
  Administered 2016-07-23: 5 mg via ORAL

## 2016-07-23 NOTE — Discharge Instructions (Signed)
Myelogram Discharge Instructions  1. Go home and rest quietly for the next 24 hours.  It is important to lie flat for the next 24 hours.  Get up only to go to the restroom.  You may lie in the bed or on a couch on your back, your stomach, your left side or your right side.  You may have one pillow under your head.  You may have pillows between your knees while you are on your side or under your knees while you are on your back.  2. DO NOT drive today.  Recline the seat as far back as it will go, while still wearing your seat belt, on the way home.  3. You may get up to go to the bathroom as needed.  You may sit up for 10 minutes to eat.  You may resume your normal diet and medications unless otherwise indicated.  Drink plenty of extra fluids today and tomorrow.  4. The incidence of a spinal headache with nausea and/or vomiting is about 5% (one in 20 patients).  If you develop a headache, lie flat and drink plenty of fluids until the headache goes away.  Caffeinated beverages may be helpful.  If you develop severe nausea and vomiting or a headache that does not go away with flat bed rest, call [phone].  5. You may resume normal activities after your 24 hours of bed rest is over; however, do not exert yourself strongly or do any heavy lifting tomorrow.  6. Call your physician for a follow-up appointment.    You may resume Effexor on Tuesday, July 24, 2016 after 9:30a.m.

## 2016-07-23 NOTE — Progress Notes (Signed)
Patient states she has been off Effexor for at least the past two days.  jkl

## 2016-07-24 ENCOUNTER — Ambulatory Visit (INDEPENDENT_AMBULATORY_CARE_PROVIDER_SITE_OTHER): Payer: 59 | Admitting: Obstetrics and Gynecology

## 2016-07-24 ENCOUNTER — Encounter: Payer: Self-pay | Admitting: Obstetrics and Gynecology

## 2016-07-24 VITALS — BP 113/85 | HR 78 | Ht 63.0 in | Wt 206.9 lb

## 2016-07-24 DIAGNOSIS — Z79890 Hormone replacement therapy (postmenopausal): Secondary | ICD-10-CM | POA: Diagnosis not present

## 2016-07-24 DIAGNOSIS — F3341 Major depressive disorder, recurrent, in partial remission: Secondary | ICD-10-CM | POA: Diagnosis not present

## 2016-07-24 DIAGNOSIS — N952 Postmenopausal atrophic vaginitis: Secondary | ICD-10-CM

## 2016-07-24 MED ORDER — VITAMIN D (ERGOCALCIFEROL) 1.25 MG (50000 UNIT) PO CAPS: 50000.0000 [IU] | ORAL_CAPSULE | ORAL | 1 refills | Status: DC

## 2016-07-24 MED ORDER — ESTRADIOL 0.1 MG/GM VA CREA: 2.0000 g | TOPICAL_CREAM | Freq: Every day | VAGINAL | 2 refills | Status: DC

## 2016-07-24 NOTE — Progress Notes (Signed)
Subjective:     Patient ID: Erika Shelton, female   DOB: [birth year], 51 y.o.   MRN: [id number]  HPI Reports a increase vaginal discharge clear to light brown; leaking urine daily- wears liner, has mesh in for bladder; fell 3 months ago and feels like it started then.  Feels like antidepressant isn't working, crying all the time. But does not relationship to increased personal stressors at home and work. Feels like ferritin is low again.  Review of Systems Negative except stated above.    Objective:   Physical Exam A&O x4 Well groomed female in no distress Blood pressure 113/85, pulse 78, height 5\' 3"  (1.6 m), weight 206 lb 14.4 oz (93.8 kg). Pelvic exam: normal external genitalia, vulva, vagina, cervix, uterus and adnexa, VAGINA: vaginal discharge - green, moderate vaginal atrophy, WET MOUNT done - results: negative for pathogens, normal epithelial cells.            Assessment:     Vaginal atrophy H/o low ferritin Depressed     Plan:     Will increase effexor to 2 tablets bid for 2 weeks and let me know if notices an improvement in symptoms Estrace cream nightly x 2 weeks then qohs x 2 weeks RTC in 4 weeks for recheck  Garald Rhew [place], CNM

## 2016-07-25 ENCOUNTER — Telehealth: Payer: Self-pay | Admitting: Obstetrics and Gynecology

## 2016-07-25 ENCOUNTER — Other Ambulatory Visit: Payer: Self-pay | Admitting: Obstetrics and Gynecology

## 2016-07-25 ENCOUNTER — Encounter: Payer: 59 | Admitting: Obstetrics and Gynecology

## 2016-07-25 LAB — THYROID PANEL WITH TSH
Free Thyroxine Index: 1.7 (ref 1.2–4.9)
T3 UPTAKE RATIO: 25 % (ref 24–39)
T4 TOTAL: 6.9 ug/dL (ref 4.5–12.0)
TSH: 2.31 u[IU]/mL (ref 0.450–4.500)

## 2016-07-25 LAB — PROGESTERONE: PROGESTERONE: 1.4 ng/mL

## 2016-07-25 LAB — VITAMIN D 25 HYDROXY (VIT D DEFICIENCY, FRACTURES): VIT D 25 HYDROXY: 29.9 ng/mL — AB (ref 30.0–100.0)

## 2016-07-25 LAB — FERRITIN: Ferritin: 321 ng/mL — ABNORMAL HIGH (ref 15–150)

## 2016-07-25 MED ORDER — ESTRADIOL 0.1 MG/GM VA CREA: 2.0000 g | TOPICAL_CREAM | Freq: Every day | VAGINAL | 2 refills | Status: DC

## 2016-07-25 NOTE — Telephone Encounter (Signed)
Done-ac 

## 2016-07-25 NOTE — Telephone Encounter (Signed)
Patient called stating she was supposed to have estrace cream and ativan sent to her pharmacy yesterday but its not there. Can you resend.Thanks

## 2016-07-26 ENCOUNTER — Other Ambulatory Visit: Payer: Self-pay | Admitting: Neurosurgery

## 2016-07-26 ENCOUNTER — Ambulatory Visit
Admission: RE | Admit: 2016-07-26 | Discharge: 2016-07-26 | Disposition: A | Discharge status: Home / Self Care | Payer: 59 | Source: Ambulatory Visit | Attending: Neurosurgery | Admitting: Neurosurgery

## 2016-07-26 DIAGNOSIS — G971 Other reaction to spinal and lumbar puncture: Secondary | ICD-10-CM

## 2016-07-26 MED ORDER — IOPAMIDOL (ISOVUE-M 200) INJECTION 41%
1.0000 mL | Freq: Once | INTRAMUSCULAR | Status: AC
  Administered 2016-07-26: 1 mL via EPIDURAL

## 2016-07-26 NOTE — Discharge Instructions (Signed)

## 2016-07-26 NOTE — Progress Notes (Signed)
20cc blood drawn from left AC space for Epidural Blood Patch; site unremarkable.  jkl

## 2016-08-06 DIAGNOSIS — N3942 Incontinence without sensory awareness: Secondary | ICD-10-CM | POA: Diagnosis not present

## 2016-08-06 DIAGNOSIS — R350 Frequency of micturition: Secondary | ICD-10-CM | POA: Diagnosis not present

## 2016-08-09 ENCOUNTER — Encounter: Payer: Self-pay | Admitting: Obstetrics and Gynecology

## 2016-08-14 DIAGNOSIS — M5137 Other intervertebral disc degeneration, lumbosacral region: Secondary | ICD-10-CM | POA: Diagnosis not present

## 2016-08-15 ENCOUNTER — Other Ambulatory Visit: Payer: Self-pay | Admitting: Neurosurgery

## 2016-08-15 DIAGNOSIS — M5137 Other intervertebral disc degeneration, lumbosacral region: Secondary | ICD-10-CM

## 2016-08-17 ENCOUNTER — Other Ambulatory Visit: Payer: Self-pay | Admitting: Specialist

## 2016-08-17 DIAGNOSIS — M25552 Pain in left hip: Principal | ICD-10-CM

## 2016-08-17 DIAGNOSIS — M25551 Pain in right hip: Secondary | ICD-10-CM

## 2016-08-21 ENCOUNTER — Other Ambulatory Visit: Payer: Self-pay | Admitting: Specialist

## 2016-08-21 DIAGNOSIS — M25551 Pain in right hip: Secondary | ICD-10-CM

## 2016-08-21 DIAGNOSIS — M25552 Pain in left hip: Principal | ICD-10-CM

## 2016-08-24 ENCOUNTER — Ambulatory Visit
Admission: RE | Admit: 2016-08-24 | Discharge: 2016-08-24 | Disposition: A | Discharge status: Home / Self Care | Payer: 59 | Source: Ambulatory Visit | Attending: Neurosurgery | Admitting: Neurosurgery

## 2016-08-24 ENCOUNTER — Other Ambulatory Visit: Payer: Self-pay | Admitting: Specialist

## 2016-08-24 ENCOUNTER — Other Ambulatory Visit: Payer: 59

## 2016-08-24 DIAGNOSIS — M5126 Other intervertebral disc displacement, lumbar region: Secondary | ICD-10-CM | POA: Diagnosis not present

## 2016-08-24 DIAGNOSIS — M25552 Pain in left hip: Principal | ICD-10-CM

## 2016-08-24 DIAGNOSIS — M5137 Other intervertebral disc degeneration, lumbosacral region: Secondary | ICD-10-CM

## 2016-08-24 DIAGNOSIS — M25551 Pain in right hip: Secondary | ICD-10-CM

## 2016-08-24 MED ORDER — IOPAMIDOL (ISOVUE-M 200) INJECTION 41%
1.0000 mL | Freq: Once | INTRAMUSCULAR | Status: AC
  Administered 2016-08-24: 1 mL via EPIDURAL

## 2016-08-24 MED ORDER — METHYLPREDNISOLONE ACETATE 40 MG/ML INJ SUSP (RADIOLOG
120.0000 mg | Freq: Once | INTRAMUSCULAR | Status: AC
  Administered 2016-08-24: 120 mg via EPIDURAL

## 2016-08-24 NOTE — Discharge Instructions (Signed)

## 2016-09-04 ENCOUNTER — Other Ambulatory Visit: Payer: Self-pay | Admitting: *Deleted

## 2016-09-04 MED ORDER — ZOLPIDEM TARTRATE 10 MG PO TABS
10.0000 mg | ORAL_TABLET | Freq: Every day | ORAL | 2 refills | Status: DC
Start: 2016-09-04 — End: 2017-04-24

## 2016-09-04 MED FILL — ZOLPIDEM TARTRATE 10 MG TAB: 10 | 30 days supply | Qty: 30 | Fill #0

## 2016-09-05 ENCOUNTER — Other Ambulatory Visit: Payer: Self-pay | Admitting: Specialist

## 2016-09-05 DIAGNOSIS — M25551 Pain in right hip: Secondary | ICD-10-CM

## 2016-09-05 DIAGNOSIS — M25552 Pain in left hip: Principal | ICD-10-CM

## 2016-09-06 ENCOUNTER — Ambulatory Visit
Admission: RE | Admit: 2016-09-06 | Discharge: 2016-09-06 | Disposition: A | Discharge status: Home / Self Care | Payer: 59 | Source: Ambulatory Visit | Attending: Specialist | Admitting: Specialist

## 2016-09-06 DIAGNOSIS — M25551 Pain in right hip: Secondary | ICD-10-CM | POA: Diagnosis not present

## 2016-09-06 DIAGNOSIS — M25552 Pain in left hip: Principal | ICD-10-CM

## 2016-09-06 DIAGNOSIS — S76011A Strain of muscle, fascia and tendon of right hip, initial encounter: Secondary | ICD-10-CM | POA: Diagnosis not present

## 2016-09-06 MED ORDER — LIDOCAINE HCL (PF) 1 % IJ SOLN
5.0000 mL | Freq: Once | INTRAMUSCULAR | Status: DC
  Filled 2016-09-06: qty 5

## 2016-09-06 MED ORDER — IOPAMIDOL (ISOVUE-200) INJECTION 41%
15.0000 mL | Freq: Once | INTRAVENOUS | Status: AC
  Administered 2016-09-06: 15 mL via INTRA_ARTICULAR
  Filled 2016-09-06: qty 15

## 2016-09-06 MED ORDER — IOPAMIDOL (ISOVUE-200) INJECTION 41%
15.0000 mL | Freq: Once | INTRAVENOUS | Status: DC
  Filled 2016-09-06: qty 15

## 2016-09-06 MED ORDER — LIDOCAINE HCL (PF) 1 % IJ SOLN
10.0000 mL | Freq: Once | INTRAMUSCULAR | Status: AC
  Administered 2016-09-06: 10 mL
  Filled 2016-09-06: qty 10

## 2016-09-10 MED FILL — VIT D2 1.25 MG (50,000 UNIT: 1.25 MG | 28 days supply | Qty: 4 | Fill #0

## 2016-09-10 MED FILL — VENLAFAXINE HCL ER 37.5 MG: 37.5 | 30 days supply | Qty: 60 | Fill #0

## 2016-09-10 MED FILL — ESTRADIOL 0.1 MG/GM CRM: 0.1 | 21 days supply | Qty: 43 | Fill #0

## 2016-09-10 MED FILL — LORazepam 2 MG TABS: 2 | 30 days supply | Qty: 30 | Fill #0

## 2016-09-11 ENCOUNTER — Other Ambulatory Visit: Payer: 59

## 2016-09-11 DIAGNOSIS — M25552 Pain in left hip: Secondary | ICD-10-CM | POA: Diagnosis not present

## 2016-09-11 DIAGNOSIS — M25551 Pain in right hip: Secondary | ICD-10-CM | POA: Diagnosis not present

## 2016-09-13 ENCOUNTER — Other Ambulatory Visit: Payer: Self-pay | Admitting: Specialist

## 2016-09-13 DIAGNOSIS — M25551 Pain in right hip: Secondary | ICD-10-CM

## 2016-09-13 DIAGNOSIS — M25552 Pain in left hip: Principal | ICD-10-CM

## 2016-10-02 ENCOUNTER — Other Ambulatory Visit: Payer: Self-pay | Admitting: Obstetrics and Gynecology

## 2016-10-02 ENCOUNTER — Encounter: Payer: Self-pay | Admitting: Obstetrics and Gynecology

## 2016-10-02 DIAGNOSIS — Z1231 Encounter for screening mammogram for malignant neoplasm of breast: Secondary | ICD-10-CM

## 2016-10-02 MED ORDER — CARISOPRODOL 350 MG PO TABS: 350.0000 mg | ORAL_TABLET | Freq: Three times a day (TID) | ORAL | 2 refills | Status: AC | PRN

## 2016-10-02 MED ORDER — VITAMIN D (ERGOCALCIFEROL) 1.25 MG (50000 UNIT) PO CAPS: 50000.0000 [IU] | ORAL_CAPSULE | ORAL | 1 refills | Status: DC

## 2016-10-02 MED ORDER — VENLAFAXINE HCL ER 75 MG PO CP24: 75.0000 mg | ORAL_CAPSULE | Freq: Two times a day (BID) | ORAL | 2 refills | Status: DC

## 2016-10-05 ENCOUNTER — Other Ambulatory Visit: Payer: Self-pay | Admitting: Obstetrics and Gynecology

## 2016-10-05 MED FILL — ESTRADIOL 0.1 MG/GM CRM: 0.1 | 21 days supply | Qty: 43 | Fill #1 | Status: TO

## 2016-10-05 MED FILL — VIT D2 1.25 MG (50,000 UNIT: 1.25 MG | 28 days supply | Qty: 4 | Fill #1

## 2016-10-05 MED FILL — ZOLPIDEM TARTRATE 10 MG TAB: 10 | 30 days supply | Qty: 30 | Fill #1

## 2016-10-05 MED FILL — CARISOPRODOL 350 MG TABLET: 350 | 30 days supply | Qty: 90 | Fill #0

## 2016-10-05 MED FILL — VENLAFAXINE HCL ER 75 MG CA: 75 | 45 days supply | Qty: 90 | Fill #0

## 2016-10-08 ENCOUNTER — Other Ambulatory Visit: Payer: Self-pay | Admitting: *Deleted

## 2016-10-08 ENCOUNTER — Encounter: Payer: Self-pay | Admitting: Obstetrics and Gynecology

## 2016-10-08 MED ORDER — LORAZEPAM 2 MG PO TABS: 2.0000 mg | ORAL_TABLET | Freq: Every day | ORAL | 1 refills | Status: DC

## 2016-10-08 MED FILL — LORazepam 2 MG TABS: 2 | 30 days supply | Qty: 30 | Fill #0

## 2016-10-11 ENCOUNTER — Ambulatory Visit: Payer: 59 | Admitting: Podiatry

## 2016-10-18 ENCOUNTER — Ambulatory Visit (INDEPENDENT_AMBULATORY_CARE_PROVIDER_SITE_OTHER): Payer: 59

## 2016-10-18 ENCOUNTER — Ambulatory Visit (INDEPENDENT_AMBULATORY_CARE_PROVIDER_SITE_OTHER): Payer: 59 | Admitting: Podiatry

## 2016-10-18 ENCOUNTER — Encounter: Payer: Self-pay | Admitting: Podiatry

## 2016-10-18 DIAGNOSIS — M779 Enthesopathy, unspecified: Secondary | ICD-10-CM | POA: Diagnosis not present

## 2016-10-18 DIAGNOSIS — M722 Plantar fascial fibromatosis: Secondary | ICD-10-CM | POA: Diagnosis not present

## 2016-10-18 MED ORDER — METHYLPREDNISOLONE 4 MG PO TBPK: ORAL_TABLET | ORAL | 0 refills | Status: DC

## 2016-10-18 MED ORDER — MELOXICAM 15 MG PO TABS: 15.0000 mg | ORAL_TABLET | Freq: Every day | ORAL | 3 refills | Status: DC

## 2016-10-18 NOTE — Progress Notes (Signed)
   Subjective:    Patient ID: Erika Shelton, female    DOB: 02-23-1966, 51 y.o.   MRN: [id number]  HPI: She presents today as a new patient with chief complaint of pain to the medial plantar heel. She states that just been having more pain and swelling with these feet recently.    Review of Systems  All other systems reviewed and are negative.      Objective:   Physical Exam: Vital signs are stable alert and oriented 3. Pulses are palpable. Neurologic sensorium is intact. Deep tendon reflexes are intact. Muscle strength is normal. She has pain on palpation medial calcaneal joint was bilateral heels. Radiographically demonstrate plantar distally oriented calcaneal heel spurs and soft tissue increased density of the plantar fascia calcaneal insertion site. No lesions or wounds are noted.        Assessment & Plan:  Assessment: Fasciitis bilateral.  Plan: Start her on a Medrol Dosepak to be followed by meloxicam. Injected the bilateral heels today with Kenalog and local anesthetic blister plantar fascia braces bilateral night splints. Many discuss orthotics next visit.

## 2016-10-18 NOTE — Patient Instructions (Signed)

## 2016-10-26 ENCOUNTER — Ambulatory Visit: Payer: Self-pay | Admitting: Specialist

## 2016-11-08 ENCOUNTER — Ambulatory Visit: Admit: 2016-11-08 | Payer: 59 | Admitting: Specialist

## 2016-11-08 SURGERY — ARTHROSCOPY, KNEE, WITH MEDIAL MENISCECTOMY
Anesthesia: General | Laterality: Left

## 2016-11-09 ENCOUNTER — Encounter: Payer: Self-pay | Admitting: Obstetrics and Gynecology

## 2016-11-09 ENCOUNTER — Other Ambulatory Visit: Payer: Self-pay | Admitting: Obstetrics and Gynecology

## 2016-11-09 MED ORDER — PREDNISOLONE 5 MG (48) PO TBPK: 5.0000 mg | ORAL_TABLET | Freq: Every day | ORAL | 0 refills | Status: DC

## 2016-11-13 ENCOUNTER — Ambulatory Visit
Admission: RE | Admit: 2016-11-13 | Discharge: 2016-11-13 | Disposition: A | Discharge status: Home / Self Care | Payer: 59 | Source: Ambulatory Visit | Attending: Obstetrics and Gynecology | Admitting: Obstetrics and Gynecology

## 2016-11-13 DIAGNOSIS — Z1231 Encounter for screening mammogram for malignant neoplasm of breast: Secondary | ICD-10-CM | POA: Insufficient documentation

## 2016-11-15 ENCOUNTER — Ambulatory Visit: Payer: 59 | Admitting: Podiatry

## 2016-11-19 ENCOUNTER — Encounter: Payer: Self-pay | Admitting: Obstetrics and Gynecology

## 2016-11-19 MED FILL — VIT D2 1.25 MG (50,000 UNIT: 1.25 MG | 28 days supply | Qty: 4 | Fill #2

## 2016-11-19 MED FILL — VENLAFAXINE HCL ER 75 MG CA: 75 | 45 days supply | Qty: 90 | Fill #1

## 2016-11-19 MED FILL — CARISOPRODOL 350 MG TABLET: 350 | 30 days supply | Qty: 90 | Fill #1

## 2016-11-19 MED FILL — ZOLPIDEM TARTRATE 10 MG TAB: 10 | 30 days supply | Qty: 30 | Fill #2

## 2016-11-19 MED FILL — LORazepam 2 MG TABS: 2 | 30 days supply | Qty: 30 | Fill #1

## 2016-11-19 MED FILL — PROGESTERONE 200 MG CAPSULE: 200 | 90 days supply | Qty: 90 | Fill #0 | Status: TO

## 2016-12-17 DIAGNOSIS — M172 Bilateral post-traumatic osteoarthritis of knee: Secondary | ICD-10-CM | POA: Diagnosis not present

## 2016-12-17 DIAGNOSIS — M25562 Pain in left knee: Secondary | ICD-10-CM | POA: Diagnosis not present

## 2017-01-01 ENCOUNTER — Other Ambulatory Visit: Payer: Self-pay | Admitting: Obstetrics and Gynecology

## 2017-01-01 MED FILL — VENLAFAXINE HCL ER 75 MG CA: 75 | 45 days supply | Qty: 90 | Fill #2 | Status: TO

## 2017-01-01 MED FILL — LEVOTHYROXINE 50 MCG TABLET: 50 | 90 days supply | Qty: 90 | Fill #0 | Status: TO

## 2017-01-01 MED FILL — VIT D2 1.25 MG (50,000 UNIT: 1.25 MG | 28 days supply | Qty: 4 | Fill #3 | Status: TO

## 2017-01-01 MED FILL — ZOLPIDEM TARTRATE 10 MG TAB: 10 | 30 days supply | Qty: 30 | Fill #0 | Status: TO

## 2017-01-01 MED FILL — LORazepam 2 MG TABS: 2 | 30 days supply | Qty: 30 | Fill #0 | Status: TO

## 2017-01-01 MED FILL — CARISOPRODOL 350 MG TABLET: 350 | 30 days supply | Qty: 90 | Fill #2

## 2017-01-02 ENCOUNTER — Encounter: Payer: 59 | Admitting: Obstetrics and Gynecology

## 2017-01-04 ENCOUNTER — Other Ambulatory Visit: Payer: Self-pay | Admitting: Obstetrics and Gynecology

## 2017-01-04 MED FILL — ME-NAPHOS-MB-HYO 1 TABLET: 81.6 | 5 days supply | Qty: 20 | Fill #0 | Status: TO

## 2017-02-19 ENCOUNTER — Telehealth: Payer: Self-pay | Admitting: *Deleted

## 2017-02-19 NOTE — Telephone Encounter (Signed)
[facility] faxed refill Meloxicam. Pt was to schedule follow up visits, refill denied, return fax.

## 2017-02-28 ENCOUNTER — Other Ambulatory Visit: Payer: Self-pay | Admitting: *Deleted

## 2017-02-28 ENCOUNTER — Encounter: Payer: Self-pay | Admitting: Obstetrics and Gynecology

## 2017-02-28 MED ORDER — ALBUTEROL SULFATE HFA 108 (90 BASE) MCG/ACT IN AERS: 2.0000 | INHALATION_SPRAY | Freq: Four times a day (QID) | RESPIRATORY_TRACT | 2 refills | Status: DC | PRN

## 2017-03-14 ENCOUNTER — Encounter: Payer: 59 | Admitting: Obstetrics and Gynecology

## 2017-03-21 ENCOUNTER — Encounter: Payer: 59 | Admitting: Obstetrics and Gynecology

## 2017-03-21 ENCOUNTER — Encounter: Payer: Self-pay | Admitting: Obstetrics and Gynecology

## 2017-03-21 ENCOUNTER — Ambulatory Visit (INDEPENDENT_AMBULATORY_CARE_PROVIDER_SITE_OTHER): Payer: 59 | Admitting: Obstetrics and Gynecology

## 2017-03-21 VITALS — BP 109/71 | HR 86 | Ht 63.0 in | Wt 220.2 lb

## 2017-03-21 DIAGNOSIS — E559 Vitamin D deficiency, unspecified: Secondary | ICD-10-CM

## 2017-03-21 DIAGNOSIS — R6889 Other general symptoms and signs: Secondary | ICD-10-CM | POA: Diagnosis not present

## 2017-03-21 MED ORDER — ALBUTEROL SULFATE HFA 108 (90 BASE) MCG/ACT IN AERS: 2.0000 | INHALATION_SPRAY | Freq: Four times a day (QID) | RESPIRATORY_TRACT | 2 refills | Status: AC | PRN

## 2017-03-21 MED ORDER — MONTELUKAST SODIUM 10 MG PO TABS: 10.0000 mg | ORAL_TABLET | Freq: Every day | ORAL | 6 refills | Status: DC

## 2017-03-21 MED ORDER — MELOXICAM 15 MG PO TABS: 15.0000 mg | ORAL_TABLET | Freq: Every day | ORAL | 3 refills | Status: DC

## 2017-03-21 NOTE — Addendum Note (Signed)
Addended by: Joylene Igo on: 03/21/2017 03:08 PM   Modules accepted: Orders

## 2017-03-21 NOTE — Progress Notes (Signed)
Subjective:     Patient ID: Erika Shelton, female   DOB: 1966/03/06, 51 y.o.   MRN: [id number]  HPI  Feels tired, and is cold all the time. Weight gain. Feel like iron may be low again.  States feeling better on higher dose of effexor. Desires lab draw.  Sleeping well.  Has not changed any medications recently.  Review of Systems Negative other than stated above in HPI.    Objective:   Physical Exam  A&Ox4 Well groomed female  Blood pressure 109/71, pulse 86, height 5\' 3"  (1.6 m), weight 220 lb 3.2 oz (99.9 kg). Body mass index is 39.01 kg/m.  PE not indicated.      Assessment:     Cold intolerance Fatigue Vitamin d deficiency    Plan:     Labs obtained-will follow up accordingly.  past due for Annual- will schedule in next 2-4 weeks.  Melody shambley, CNM

## 2017-03-22 LAB — CBC
HEMATOCRIT: 43 % (ref 34.0–46.6)
HEMOGLOBIN: 13.8 g/dL (ref 11.1–15.9)
MCH: 29.1 pg (ref 26.6–33.0)
MCHC: 32.1 g/dL (ref 31.5–35.7)
MCV: 91 fL (ref 79–97)
Platelets: 326 10*3/uL (ref 150–379)
RBC: 4.75 x10E6/uL (ref 3.77–5.28)
RDW: 13.2 % (ref 12.3–15.4)
WBC: 8.3 10*3/uL (ref 3.4–10.8)

## 2017-03-22 LAB — RHEUMATOID FACTOR: Rhuematoid fact SerPl-aCnc: <10 IU/mL (ref 0.0–13.9)

## 2017-03-22 LAB — THYROID PANEL WITH TSH
Free Thyroxine Index: 1.6 (ref 1.2–4.9)
T3 UPTAKE RATIO: 22 % — AB (ref 24–39)
T4 TOTAL: 7.4 ug/dL (ref 4.5–12.0)
TSH: 1.23 u[IU]/mL (ref 0.450–4.500)

## 2017-03-22 LAB — FERRITIN: FERRITIN: 266 ng/mL — AB (ref 15–150)

## 2017-03-22 LAB — COMPREHENSIVE METABOLIC PANEL
ALK PHOS: 72 IU/L (ref 39–117)
ALT: 17 IU/L (ref 0–32)
AST: 20 IU/L (ref 0–40)
Albumin/Globulin Ratio: 1.7 (ref 1.2–2.2)
Albumin: 4.4 g/dL (ref 3.5–5.5)
BILIRUBIN TOTAL: 0.3 mg/dL (ref 0.0–1.2)
BUN/Creatinine Ratio: 13 (ref 9–23)
BUN: 10 mg/dL (ref 6–24)
CO2: 23 mmol/L (ref 20–29)
CREATININE: 0.76 mg/dL (ref 0.57–1.00)
Calcium: 9.4 mg/dL (ref 8.7–10.2)
Chloride: 103 mmol/L (ref 96–106)
GFR calc Af Amer: 104 mL/min/{1.73_m2} (ref >59)
GFR calc non Af Amer: 90 mL/min/{1.73_m2} (ref >59)
GLOBULIN, TOTAL: 2.6 g/dL (ref 1.5–4.5)
Glucose: 108 mg/dL — ABNORMAL HIGH (ref 65–99)
Potassium: 3.8 mmol/L (ref 3.5–5.2)
Sodium: 143 mmol/L (ref 134–144)
Total Protein: 7 g/dL (ref 6.0–8.5)

## 2017-03-22 LAB — FSH/LH
FSH: 44.9 m[IU]/mL
LH: 32.8 m[IU]/mL

## 2017-03-22 LAB — VITAMIN D 25 HYDROXY (VIT D DEFICIENCY, FRACTURES): VIT D 25 HYDROXY: 43.3 ng/mL (ref 30.0–100.0)

## 2017-04-02 ENCOUNTER — Other Ambulatory Visit: Payer: Self-pay | Admitting: Obstetrics and Gynecology

## 2017-04-02 ENCOUNTER — Telehealth: Payer: Self-pay | Admitting: Obstetrics and Gynecology

## 2017-04-02 NOTE — Telephone Encounter (Signed)
Patient called and lvm stating that she would like to speak with Amy. No other information was disclosed. Please advise.

## 2017-04-02 NOTE — Telephone Encounter (Signed)
Pt is having lots of sinus drainage, pressure in her teeth, coughing- productive, would like Melody to send her in something

## 2017-04-03 ENCOUNTER — Ambulatory Visit: Payer: Self-pay | Admitting: Physician Assistant

## 2017-04-03 VITALS — BP 110/80 | HR 93 | Temp 99.2°F

## 2017-04-03 DIAGNOSIS — J069 Acute upper respiratory infection, unspecified: Secondary | ICD-10-CM

## 2017-04-03 DIAGNOSIS — R509 Fever, unspecified: Secondary | ICD-10-CM

## 2017-04-03 LAB — POCT INFLUENZA A/B
Influenza A, POC: NEGATIVE
Influenza B, POC: NEGATIVE

## 2017-04-03 MED ORDER — CEFDINIR 300 MG PO CAPS: 300.0000 mg | ORAL_CAPSULE | Freq: Two times a day (BID) | ORAL | 0 refills | Status: DC

## 2017-04-03 NOTE — Progress Notes (Signed)
S: C/o cough and congestion with body aches for 3 days, + fever, chills, states its the worst she has ever felt; denies cp/sob, v/d; mucus was green this am but clear throughout the day, cough is sporadic, ?wheezing at night, some sob with cough, no leg pain; had flu vaccine back in september  Using otc meds:   O: PE: vitals w elevated temp at 99.0;  nad,  perrl eomi, normocephalic, tms dull, nasal mucosa red and swollen, throat injected, neck supple no lymph, lungs c t a, cv rrr, neuro intact, flu swab neg  A:  Acute flu like illness, bronchitis   P: drink fluids, continue regular meds , use otc meds of choice, return if not improving in 5 days, return earlier if worsening , omnicef 300mg  qd x 10d

## 2017-04-05 ENCOUNTER — Encounter: Payer: Self-pay | Admitting: Obstetrics and Gynecology

## 2017-04-05 ENCOUNTER — Other Ambulatory Visit: Payer: Self-pay | Admitting: Obstetrics and Gynecology

## 2017-04-05 ENCOUNTER — Ambulatory Visit (INDEPENDENT_AMBULATORY_CARE_PROVIDER_SITE_OTHER): Payer: 59 | Admitting: Obstetrics and Gynecology

## 2017-04-05 VITALS — BP 107/77 | HR 85 | Ht 63.0 in | Wt 217.3 lb

## 2017-04-05 DIAGNOSIS — Z01419 Encounter for gynecological examination (general) (routine) without abnormal findings: Secondary | ICD-10-CM

## 2017-04-05 NOTE — Progress Notes (Signed)
Subjective:   Erika Shelton is a 51 y.o. G0P0 Caucasian female here for a routine well-woman exam.  No LMP recorded. Patient has had a hysterectomy.    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  Past Medical History Past Medical History:  Diagnosis Date  . Anxiety   . Asthma   . Complication of anesthesia    nausea  . Urinary incontinence     Past Surgical History Past Surgical History:  Procedure Laterality Date  . BLADDER SURGERY     X2   . FOOT SURGERY Bilateral   . interstem     . LAPAROSCOPIC GASTRIC BANDING    . LAPAROSCOPIC GASTRIC RESTRICTIVE DUODENAL PROCEDURE (DUODENAL SWITCH) N/A 12/07/2014   Procedure: LAPAROSCOPIC GASTRIC RESTRICTIVE DUODENAL PROCEDURE (DUODENAL SWITCH);  Surgeon: Bonner Puna, MD;  Location: [facility] ORS;  Service: General;  Laterality: N/A;  . LAPAROSCOPIC GASTRIC SLEEVE RESECTION    . VAGINAL HYSTERECTOMY      Gynecologic History G0P0  No LMP recorded. Patient has had a hysterectomy. Contraception: post menopausal status Last Pap: ?Marland Kitchen Results were: normal Last mammogram: 10/2016. Results were: normal   Obstetric History OB History  Gravida Para Term Preterm AB Living  0            SAB TAB Ectopic Multiple Live Births                   Current Medications Current Outpatient Prescriptions on File Prior to Visit  Medication Sig Dispense Refill  . albuterol (PROVENTIL HFA;VENTOLIN HFA) 108 (90 Base) MCG/ACT inhaler Inhale 2 puffs into the lungs every 6 (six) hours as needed  for wheezing or shortness of breath. 1 Inhaler 2  . ALPRAZolam (XANAX) 0.5 MG tablet Take 1 tablet (0.5 mg total) by mouth 3 (three) times daily as needed for anxiety. 90 tablet 1  . carisoprodol (SOMA) 350 MG tablet Take 1 tablet (350 mg total) by mouth 3 (three) times daily as needed for muscle spasms. 90 tablet 2  . cefdinir (OMNICEF) 300 MG capsule Take 1 capsule (300 mg total) by mouth 2 (two) times daily. 20 capsule 0  . cyanocobalamin (,VITAMIN B-12,) 1000 MCG/ML injection Inject 1 mL (1,000 mcg total) into the muscle every 30 (thirty) days. 10 mL 1  . estradiol (ESTRACE VAGINAL) 0.1 MG/GM vaginal cream Place 2 g vaginally daily. 42.5 g 2  . levothyroxine (SYNTHROID) 50 MCG tablet Take 1 tablet (50 mcg total) by mouth daily before breakfast. 90 tablet 3  . LORazepam (ATIVAN) 2 MG tablet Take 1 tablet (2 mg total) by mouth daily. 30 tablet 1  . meloxicam (MOBIC) 15 MG tablet Take 1 tablet (15 mg total) by mouth daily. 30 tablet 3  . Methen-Hyosc-Meth Blue-Na Phos (ME/NAPHOS/MB/HYO1) 81.6 MG TABS TAKE 1 TABLET BY MOUTH FOUR TIMES DAILY 20 tablet 3  . methocarbamol (ROBAXIN) 500 MG tablet Take 1 tablet (500 mg total) by mouth 4 (four) times daily. 16 tablet 0  . montelukast (SINGULAIR) 10 MG tablet Take 1 tablet (10 mg total) by mouth at bedtime. 30 tablet 6  .  progesterone (PROMETRIUM) 200 MG capsule Take 1 capsule (200 mg total) by mouth daily. 90 capsule 4  . venlafaxine XR (EFFEXOR-XR) 75 MG 24 hr capsule TAKE 1 CAPSULE BY MOUTH 2 TIMES DAILY. 90 capsule 2  . Vitamin D, Ergocalciferol, (DRISDOL) 50000 units CAPS capsule Take 1 capsule (50,000 Units total) by mouth every 7 (seven) days. 30 capsule 1  . zolpidem (AMBIEN) 10 MG tablet Take 1 tablet (10 mg total) by mouth at bedtime. 30 tablet 2  . methylPREDNISolone (MEDROL DOSEPAK) 4 MG TBPK tablet 6 day dose pack - take as directed (Patient not taking: Reported on 03/21/2017) 21 tablet 0  . PrednisoLONE 5 MG (48) TBPK Take 5 mg by mouth daily.  Taper dose: 4 pills in the morning x 3 days, then 3 pills in the morning x 3 days, then 2 pills in the morning x 3 days, then 1 pill daily until all medication gone (Patient not taking: Reported on 04/05/2017) 48 each 0   No current facility-administered medications on file prior to visit.     Review of Systems Patient denies any headaches, blurred vision, shortness of breath, chest pain, abdominal pain, problems with bowel movements, urination, or intercourse.  Objective:  BP 107/77   Pulse 85   Ht 5\' 3"  (1.6 m)   Wt 217 lb 4.8 oz (98.6 kg)   BMI 38.49 kg/m  Physical Exam  General:  Well developed, well nourished, no acute distress. She is alert and oriented x3. Skin:  Warm and dry Neck:  Midline trachea, no thyromegaly or nodules Cardiovascular: Regular rate and rhythm, no murmur heard Lungs:  Effort normal, all lung fields clear to auscultation bilaterally Breasts:  No dominant palpable mass, retraction, or nipple discharge Abdomen:  Soft, non tender, no hepatosplenomegaly or masses Pelvic:  External genitalia is normal in appearance.  The vagina is normal in appearance. The cervix is bulbous, no CMT.  Thin prep pap is done with HR HPV cotesting. Uterus is felt to be normal size, shape, and contour.  No adnexal masses or tenderness noted. Extremities:  No swelling or varicosities noted Psych:  She has a normal mood and affect  Assessment:   Healthy well-woman exam  Plan:   F/U 1 year for AE, or sooner if needed   Melody Rockney Ghee, CNM

## 2017-04-08 LAB — CYTOLOGY - PAP

## 2017-04-16 DIAGNOSIS — Z800 Family history of malignant neoplasm of digestive organs: Secondary | ICD-10-CM | POA: Diagnosis not present

## 2017-04-16 DIAGNOSIS — Z8371 Family history of colonic polyps: Secondary | ICD-10-CM | POA: Diagnosis not present

## 2017-04-16 DIAGNOSIS — R1310 Dysphagia, unspecified: Secondary | ICD-10-CM | POA: Diagnosis not present

## 2017-04-24 ENCOUNTER — Other Ambulatory Visit: Payer: Self-pay | Admitting: Obstetrics and Gynecology

## 2017-05-27 ENCOUNTER — Other Ambulatory Visit: Payer: Self-pay | Admitting: Obstetrics and Gynecology

## 2017-06-12 DIAGNOSIS — H5213 Myopia, bilateral: Secondary | ICD-10-CM | POA: Diagnosis not present

## 2017-06-12 DIAGNOSIS — H524 Presbyopia: Secondary | ICD-10-CM | POA: Diagnosis not present

## 2017-07-03 ENCOUNTER — Other Ambulatory Visit: Payer: Self-pay | Admitting: Obstetrics and Gynecology

## 2017-07-22 ENCOUNTER — Ambulatory Visit: Payer: 59

## 2017-07-22 ENCOUNTER — Ambulatory Visit: Payer: 59 | Admitting: Podiatry

## 2017-07-22 ENCOUNTER — Other Ambulatory Visit: Payer: Self-pay | Admitting: Obstetrics and Gynecology

## 2017-07-22 ENCOUNTER — Encounter: Payer: Self-pay | Admitting: Podiatry

## 2017-07-22 DIAGNOSIS — M7750 Other enthesopathy of unspecified foot: Secondary | ICD-10-CM | POA: Diagnosis not present

## 2017-07-22 DIAGNOSIS — M779 Enthesopathy, unspecified: Secondary | ICD-10-CM

## 2017-07-22 DIAGNOSIS — M76829 Posterior tibial tendinitis, unspecified leg: Secondary | ICD-10-CM | POA: Diagnosis not present

## 2017-07-22 MED ORDER — MELOXICAM 15 MG PO TABS: 15.0000 mg | ORAL_TABLET | Freq: Every day | ORAL | 3 refills | Status: DC

## 2017-07-22 NOTE — Progress Notes (Signed)
She presents today after having not seen her for the past several months.  She states that her feet hurt so badly she can hardly stand on them.  She states that she is gained a lot of weight because she is unable to exercise.  She states that her feet are starting to hurt along the medial aspect of the ankles at this point.  She also has some pain on the lateral aspect of the foot but states that the medial aspect of the heel where the plantar fasciitis was there is no longer painful.  She also states that she cannot wear her orthotics and her dress shoes to work.  Objective: Vital signs are stable she is alert and oriented x3.  Pulses are palpable.  She still has severe pain on palpation medial calcaneal tubercle which she did not realize was painful.  She has some tenderness on palpation of the lateral calcaneal tubercle bilaterally.  Posterior tibial tendon pain is also prominent with fluctuance in the tendon sheath consistent with peri-tendinosis or posterior tibial tendinitis.  Assessment: Chronic intractable plantar fasciitis with compensation resulting in posterior tibial tendinitis.  Plan: After a sterile Betadine skin prep and verbal confirmation I injected the tendon sheath today with 20 mg of Kenalog 5 mg of Marcaine to the point of maximal tenderness.  She tolerated this well.  We will send her for physical therapy starting this week.  We will also refill her meloxicam.  She will also follow-up with Liliane Channel for orthotics.

## 2017-07-25 ENCOUNTER — Encounter: Payer: Self-pay | Admitting: *Deleted

## 2017-07-26 ENCOUNTER — Ambulatory Visit
Admission: RE | Admit: 2017-07-26 | Discharge: 2017-07-26 | Disposition: A | Discharge status: Home / Self Care | Payer: 59 | Source: Ambulatory Visit | Attending: Unknown Physician Specialty | Admitting: Unknown Physician Specialty

## 2017-07-26 ENCOUNTER — Encounter
Admission: RE | Disposition: A | Discharge status: Home / Self Care | Payer: Self-pay | Source: Ambulatory Visit | Attending: Unknown Physician Specialty

## 2017-07-26 ENCOUNTER — Ambulatory Visit: Payer: 59 | Admitting: Anesthesiology

## 2017-07-26 DIAGNOSIS — Z8249 Family history of ischemic heart disease and other diseases of the circulatory system: Secondary | ICD-10-CM | POA: Diagnosis not present

## 2017-07-26 DIAGNOSIS — Z79899 Other long term (current) drug therapy: Secondary | ICD-10-CM | POA: Insufficient documentation

## 2017-07-26 DIAGNOSIS — D126 Benign neoplasm of colon, unspecified: Secondary | ICD-10-CM | POA: Diagnosis not present

## 2017-07-26 DIAGNOSIS — Z885 Allergy status to narcotic agent status: Secondary | ICD-10-CM | POA: Insufficient documentation

## 2017-07-26 DIAGNOSIS — E669 Obesity, unspecified: Secondary | ICD-10-CM | POA: Diagnosis not present

## 2017-07-26 DIAGNOSIS — K648 Other hemorrhoids: Secondary | ICD-10-CM | POA: Diagnosis not present

## 2017-07-26 DIAGNOSIS — F419 Anxiety disorder, unspecified: Secondary | ICD-10-CM | POA: Insufficient documentation

## 2017-07-26 DIAGNOSIS — Z888 Allergy status to other drugs, medicaments and biological substances status: Secondary | ICD-10-CM | POA: Insufficient documentation

## 2017-07-26 DIAGNOSIS — K635 Polyp of colon: Secondary | ICD-10-CM | POA: Diagnosis not present

## 2017-07-26 DIAGNOSIS — Z9884 Bariatric surgery status: Secondary | ICD-10-CM | POA: Diagnosis not present

## 2017-07-26 DIAGNOSIS — J45909 Unspecified asthma, uncomplicated: Secondary | ICD-10-CM | POA: Insufficient documentation

## 2017-07-26 DIAGNOSIS — J4520 Mild intermittent asthma, uncomplicated: Secondary | ICD-10-CM | POA: Insufficient documentation

## 2017-07-26 DIAGNOSIS — Z800 Family history of malignant neoplasm of digestive organs: Secondary | ICD-10-CM | POA: Diagnosis not present

## 2017-07-26 DIAGNOSIS — Z881 Allergy status to other antibiotic agents status: Secondary | ICD-10-CM | POA: Diagnosis not present

## 2017-07-26 DIAGNOSIS — Z1211 Encounter for screening for malignant neoplasm of colon: Secondary | ICD-10-CM | POA: Insufficient documentation

## 2017-07-26 DIAGNOSIS — Z8371 Family history of colonic polyps: Secondary | ICD-10-CM | POA: Insufficient documentation

## 2017-07-26 DIAGNOSIS — Z6837 Body mass index (BMI) 37.0-37.9, adult: Secondary | ICD-10-CM | POA: Insufficient documentation

## 2017-07-26 DIAGNOSIS — K640 First degree hemorrhoids: Secondary | ICD-10-CM | POA: Insufficient documentation

## 2017-07-26 DIAGNOSIS — D120 Benign neoplasm of cecum: Secondary | ICD-10-CM | POA: Insufficient documentation

## 2017-07-26 DIAGNOSIS — Z903 Acquired absence of stomach [part of]: Secondary | ICD-10-CM | POA: Diagnosis not present

## 2017-07-26 DIAGNOSIS — R1310 Dysphagia, unspecified: Secondary | ICD-10-CM | POA: Insufficient documentation

## 2017-07-26 DIAGNOSIS — M1990 Unspecified osteoarthritis, unspecified site: Secondary | ICD-10-CM | POA: Diagnosis not present

## 2017-07-26 HISTORY — PX: COLONOSCOPY WITH PROPOFOL: SHX5780

## 2017-07-26 HISTORY — PX: ESOPHAGOGASTRODUODENOSCOPY (EGD) WITH PROPOFOL: SHX5813

## 2017-07-26 HISTORY — DX: Headache: R51

## 2017-07-26 HISTORY — DX: Headache, unspecified: R51.9

## 2017-07-26 SURGERY — COLONOSCOPY WITH PROPOFOL
Anesthesia: General

## 2017-07-26 MED ORDER — MIDAZOLAM HCL 2 MG/2ML IJ SOLN
INTRAMUSCULAR | Status: AC
  Filled 2017-07-26: qty 2

## 2017-07-26 MED ORDER — LIDOCAINE HCL (PF) 2 % IJ SOLN
INTRAMUSCULAR | Status: DC | PRN
  Administered 2017-07-26: 100 mg

## 2017-07-26 MED ORDER — PROPOFOL 500 MG/50ML IV EMUL
INTRAVENOUS | Status: AC
  Filled 2017-07-26: qty 50

## 2017-07-26 MED ORDER — PROPOFOL 10 MG/ML IV BOLUS
INTRAVENOUS | Status: DC | PRN
  Administered 2017-07-26: 30 mg via INTRAVENOUS
  Administered 2017-07-26: 20 mg via INTRAVENOUS

## 2017-07-26 MED ORDER — LIDOCAINE HCL (PF) 1 % IJ SOLN
INTRAMUSCULAR | Status: AC
  Administered 2017-07-26: 0.3 mL via INTRADERMAL
  Filled 2017-07-26: qty 2

## 2017-07-26 MED ORDER — LIDOCAINE HCL (PF) 2 % IJ SOLN
INTRAMUSCULAR | Status: AC
  Filled 2017-07-26: qty 10

## 2017-07-26 MED ORDER — SODIUM CHLORIDE 0.9 % IV SOLN
INTRAVENOUS | Status: DC
  Administered 2017-07-26: 1000 mL via INTRAVENOUS

## 2017-07-26 MED ORDER — GLYCOPYRROLATE 0.2 MG/ML IJ SOLN
INTRAMUSCULAR | Status: DC | PRN
  Administered 2017-07-26: 0.2 mg via INTRAVENOUS

## 2017-07-26 MED ORDER — LIDOCAINE HCL (PF) 1 % IJ SOLN
2.0000 mL | Freq: Once | INTRAMUSCULAR | Status: AC
  Administered 2017-07-26: 0.3 mL via INTRADERMAL

## 2017-07-26 MED ORDER — PROPOFOL 500 MG/50ML IV EMUL
INTRAVENOUS | Status: DC | PRN
  Administered 2017-07-26: 75 ug/kg/min via INTRAVENOUS

## 2017-07-26 MED ORDER — FENTANYL CITRATE (PF) 100 MCG/2ML IJ SOLN
INTRAMUSCULAR | Status: DC | PRN
  Administered 2017-07-26 (×2): 50 ug via INTRAVENOUS

## 2017-07-26 MED ORDER — SODIUM CHLORIDE 0.9 % IV SOLN: INTRAVENOUS | Status: DC

## 2017-07-26 MED ORDER — GLYCOPYRROLATE 0.2 MG/ML IJ SOLN
INTRAMUSCULAR | Status: AC
  Filled 2017-07-26: qty 1

## 2017-07-26 MED ORDER — MIDAZOLAM HCL 5 MG/5ML IJ SOLN
INTRAMUSCULAR | Status: DC | PRN
  Administered 2017-07-26: 2 mg via INTRAVENOUS

## 2017-07-26 MED ORDER — FENTANYL CITRATE (PF) 100 MCG/2ML IJ SOLN
INTRAMUSCULAR | Status: AC
  Filled 2017-07-26: qty 2

## 2017-07-26 NOTE — H&P (Signed)
Primary Care Physician:  Joylene Igo, CNM Primary Gastroenterologist:  Dr. Vira Agar  Pre-Procedure History & Physical: HPI:  Erika Shelton is a 52 y.o. female is here for an endoscopy and colonoscopy.   Past Medical History:  Diagnosis Date  . Anxiety   . Asthma   . Complication of anesthesia    nausea  . Headache   . Urinary incontinence     Past Surgical History:  Procedure Laterality Date  . BLADDER SURGERY     X2   . FOOT SURGERY Bilateral   . interstem     . LAPAROSCOPIC GASTRIC BANDING    . LAPAROSCOPIC GASTRIC RESTRICTIVE DUODENAL PROCEDURE (DUODENAL SWITCH) N/A 12/07/2014   Procedure: LAPAROSCOPIC GASTRIC RESTRICTIVE DUODENAL PROCEDURE (DUODENAL SWITCH);  Surgeon: Bonner Puna, MD;  Location: [facility] ORS;  Service: General;  Laterality: N/A;  . LAPAROSCOPIC GASTRIC SLEEVE RESECTION    . VAGINAL HYSTERECTOMY      Prior to Admission medications   Medication Sig Start Date End Date Taking? Authorizing Provider  albuterol (PROVENTIL HFA;VENTOLIN HFA) 108 (90 Base) MCG/ACT inhaler Inhale 2 puffs into the lungs every 6 (six) hours as needed for wheezing or shortness of breath. 03/21/17   Shambley, Melody N, CNM  ALPRAZolam (XANAX) 0.5 MG tablet Take 1 tablet (0.5 mg total) by mouth 3 (three) times daily as needed for anxiety. 11/07/15   Shambley, Melody N, CNM  carisoprodol (SOMA) 350 MG tablet Take 1 tablet (350 mg total) by mouth 3 (three) times daily as needed for muscle spasms. 10/02/16 10/02/17  Shambley, Melody N, CNM  cefdinir (OMNICEF) 300 MG capsule Take 1 capsule (300 mg total) by mouth 2 (two) times daily. 04/03/17   Fisher, Linden Dolin, PA-C  cyanocobalamin (,VITAMIN B-12,) 1000 MCG/ML injection Inject 1 mL (1,000 mcg total) into the muscle every 30 (thirty) days. 04/18/16   Shambley, Melody N, CNM  estradiol (ESTRACE) 0.1 MG/GM vaginal cream PLACE 2 GRAMS VAGINALLY DAILY. 07/03/17   Shambley, Melody N, CNM  levothyroxine (SYNTHROID) 50 MCG tablet Take 1 tablet (50 mcg  total) by mouth daily before breakfast. 04/18/16   Shambley, Melody N, CNM  LORazepam (ATIVAN) 2 MG tablet Take 1 tablet (2 mg total) by mouth daily. 10/08/16   Shambley, Melody N, CNM  LORazepam (ATIVAN) 2 MG tablet TAKE 1 TABLET BY MOUTH DAILY 05/28/17   Shambley, Melody N, CNM  meloxicam (MOBIC) 15 MG tablet Take 1 tablet (15 mg total) by mouth daily. 07/22/17   Hyatt, Max T, DPM  Methen-Hyosc-Meth Blue-Na Phos (UROGESIC-BLUE) 81.6 MG TABS TAKE 1 TABLET BY MOUTH EVERY 6 HOURS AS NEEDED 07/03/17   Shambley, Melody N, CNM  methocarbamol (ROBAXIN) 500 MG tablet Take 1 tablet (500 mg total) by mouth 4 (four) times daily. 05/09/16   Cuthriell, Charline Bills, PA-C  montelukast (SINGULAIR) 10 MG tablet Take 1 tablet (10 mg total) by mouth at bedtime. 03/21/17   Shambley, Melody N, CNM  progesterone (PROMETRIUM) 200 MG capsule TAKE 1 CAPSULE BY MOUTH ONCE DAILY 04/24/17   Shambley, Melody N, CNM  venlafaxine XR (EFFEXOR-XR) 75 MG 24 hr capsule TAKE 1 CAPSULE BY MOUTH 2 TIMES DAILY. 04/02/17   Shambley, Melody N, CNM  Vitamin D, Ergocalciferol, (DRISDOL) 50000 units CAPS capsule Take 1 capsule (50,000 Units total) by mouth every 7 (seven) days. 10/02/16   Shambley, Melody N, CNM  zolpidem (AMBIEN) 10 MG tablet TAKE ONE TABLET BY MOUTH EACH NIGHT AT BEDTIME 07/03/17   Joylene Igo, CNM  Allergies as of 06/14/2017 - Review Complete 04/05/2017  Allergen Reaction Noted  . Ciprofloxacin Nausea And Vomiting 10/14/2014  . Darvocet [propoxyphene n-acetaminophen] Itching 04/08/2013  . Percocet [oxycodone-acetaminophen] Itching 04/08/2013  . Vicodin [hydrocodone-acetaminophen] Itching 04/08/2013    Family History  Problem Relation Age of Onset  . Arthritis Mother   . Asthma Mother   . COPD Mother   . Depression Mother   . Heart disease Mother   . Hyperlipidemia Mother   . Hypertension Mother   . Cancer Father   . Breast cancer Maternal Aunt 74    Social History   Socioeconomic History  . Marital  status: Married    Spouse name: Not on file  . Number of children: Not on file  . Years of education: Not on file  . Highest education level: Not on file  Social Needs  . Financial resource strain: Not on file  . Food insecurity - worry: Not on file  . Food insecurity - inability: Not on file  . Transportation needs - medical: Not on file  . Transportation needs - non-medical: Not on file  Occupational History  . Not on file  Tobacco Use  . Smoking status: Never Smoker  . Smokeless tobacco: Never Used  Substance and Sexual Activity  . Alcohol use: No    Comment: OCCASIONALLY  . Drug use: No  . Sexual activity: Yes    Birth control/protection: Surgical  Other Topics Concern  . Not on file  Social History Narrative  . Not on file    Review of Systems: See HPI, otherwise negative ROS  Physical Exam: BP 122/74   Pulse 69   Temp (!) 97.4 F (36.3 C)   Resp 17   Ht 5\' 3"  (1.6 m)   Wt 96.2 kg (212 lb)   SpO2 99%   BMI 37.55 kg/m  General:   Alert,  pleasant and cooperative in NAD Head:  Normocephalic and atraumatic. Neck:  Supple; no masses or thyromegaly. Lungs:  Clear throughout to auscultation.    Heart:  Regular rate and rhythm. Abdomen:  Soft, nontender and nondistended. Normal bowel sounds, without guarding, and without rebound.   Neurologic:  Alert and  oriented x4;  grossly normal neurologically.  Impression/Plan: Erika Shelton is here for an endoscopy and colonoscopy to be performed for dysphagia and FH colon cancer and colon polyps.  Risks, benefits, limitations, and alternatives regarding  endoscopy and colonoscopy have been reviewed with the patient.  Questions have been answered.  All parties agreeable.   Gaylyn Cheers, MD  07/26/2017, 8:36 AM

## 2017-07-26 NOTE — Anesthesia Postprocedure Evaluation (Signed)
Anesthesia Post Note  Patient: Erika Shelton  Procedure(s) Performed: COLONOSCOPY WITH PROPOFOL (N/A ) ESOPHAGOGASTRODUODENOSCOPY (EGD) WITH PROPOFOL (N/A )  Patient location during evaluation: Endoscopy Anesthesia Type: General Level of consciousness: awake and alert and oriented Pain management: pain level controlled Vital Signs Assessment: post-procedure vital signs reviewed and stable Respiratory status: spontaneous breathing, nonlabored ventilation and respiratory function stable Cardiovascular status: blood pressure returned to baseline and stable Postop Assessment: no signs of nausea or vomiting Anesthetic complications: no     Last Vitals:  Vitals:   07/26/17 0930 07/26/17 0950  BP: (!) 74/47 106/67  Pulse: 70 67  Resp: 12 (!) 21  Temp:    SpO2: 98% 99%    Last Pain: There were no vitals filed for this visit.               Jacobb Alen

## 2017-07-26 NOTE — Op Note (Signed)
[facility] Gastroenterology Patient Name: Erika Shelton Procedure Date: 07/26/2017 8:28 AM MRN: [id number] Account #: [id number] Date of Birth: [birth year] Admit Type: Outpatient Age: 52 Room: [facility] ENDO ROOM 4 Gender: Female Note Status: Finalized Procedure:            Colonoscopy Indications:          Family history of colon cancer in a first-degree                        relative Providers:            Manya Silvas, MD Referring MD:         Melody N. Gayla Medicus CNM, CNM (Referring MD) Medicines:            Propofol per Anesthesia Complications:        No immediate complications. Procedure:            Pre-Anesthesia Assessment:                       - After reviewing the risks and benefits, the patient                        was deemed in satisfactory condition to undergo the                        procedure.                       After obtaining informed consent, the colonoscope was                        passed under direct vision. Throughout the procedure,                        the patient's blood pressure, pulse, and oxygen                        saturations were monitored continuously. The                        Colonoscope was introduced through the anus and                        advanced to the the cecum, identified by appendiceal                        orifice and ileocecal valve. The colonoscopy was                        performed without difficulty. The patient tolerated the                        procedure well. The quality of the bowel preparation                        was good. Findings:      A small polyp was found in the proximal ascending colon. The polyp was       sessile. The polyp was removed with a hot snare. Resection and retrieval       were complete. To prevent bleeding after the polypectomy, two hemostatic  clips were successfully placed. There was no bleeding at the end of the       procedure.      Internal hemorrhoids were  found during endoscopy. The hemorrhoids were       small and Grade I (internal hemorrhoids that do not prolapse). Impression:           - One small polyp in the proximal ascending colon,                        removed with a hot snare. Resected and retrieved. Clips                        were placed.                       - Internal hemorrhoids. Recommendation:       - Await pathology results. Procedure Code(s):    --- Professional ---                       [phone], Colonoscopy, flexible; with removal of tumor(s),                        polyp(s), or other lesion(s) by snare technique Diagnosis Code(s):    --- Professional ---                       D12.2, Benign neoplasm of ascending colon                       K64.0, First degree hemorrhoids                       Z80.0, Family history of malignant neoplasm of                        digestive organs CPT copyright 2016 American Medical Association. All rights reserved. The codes documented in this report are preliminary and upon coder review may  be revised to meet current compliance requirements. Manya Silvas, MD 07/26/2017 9:28:59 AM This report has been signed electronically. Number of Addenda: 0 Note Initiated On: 07/26/2017 8:28 AM Scope Withdrawal Time: 0 hours 18 minutes 23 seconds  Total Procedure Duration: 0 hours 22 minutes 16 seconds       [facility]

## 2017-07-26 NOTE — Transfer of Care (Signed)
Immediate Anesthesia Transfer of Care Note  Patient: Erika Shelton  Procedure(s) Performed: COLONOSCOPY WITH PROPOFOL (N/A ) ESOPHAGOGASTRODUODENOSCOPY (EGD) WITH PROPOFOL (N/A )  Patient Location: PACU  Anesthesia Type:General  Level of Consciousness: sedated  Airway & Oxygen Therapy: Patient Spontanous Breathing and Patient connected to nasal cannula oxygen  Post-op Assessment: Report given to RN and Post -op Vital signs reviewed and stable  Post vital signs: Reviewed and stable  Last Vitals:  Vitals:   07/26/17 0800  BP: 122/74  Pulse: 69  Resp: 17  Temp: (!) 36.3 C  SpO2: 99%    Last Pain: There were no vitals filed for this visit.       Complications: No apparent anesthesia complications

## 2017-07-26 NOTE — Op Note (Signed)
[facility] Gastroenterology Patient Name: Erika Shelton Procedure Date: 07/26/2017 8:27 AM MRN: [id number] Account #: [id number] Date of Birth: May 13, 1966 Admit Type: Outpatient Age: 52 Room: [facility] ENDO ROOM 4 Gender: Female Note Status: Finalized Procedure:            Upper GI endoscopy Indications:          Dysphagia Providers:            Manya Silvas, MD Referring MD:         Melody N. Gayla Medicus CNM, CNM (Referring MD) Medicines:            Propofol per Anesthesia Complications:        No immediate complications. Procedure:            Pre-Anesthesia Assessment:                       - After reviewing the risks and benefits, the patient                        was deemed in satisfactory condition to undergo the                        procedure.                       After obtaining informed consent, the endoscope was                        passed under direct vision. Throughout the procedure,                        the patient's blood pressure, pulse, and oxygen                        saturations were monitored continuously. The Endoscope                        was introduced through the mouth, and advanced to the                        jejunum. The upper GI endoscopy was accomplished                        without difficulty. The patient tolerated the procedure                        well. Findings:      The gastroesophageal junction, cardia and examined esophagus were       normal. No stenosis seen so no dilatation needed.      Evidence of a sleeve gastrectomy was found in the gastric body.      A small clip seen at gastro-intestinal junction. No ulcers or gastritis       seen. Impression:           - Normal gastroesophageal junction, cardia and                        esophagus.                       - A sleeve gastrectomy was found.                       -  No specimens collected. Recommendation:       - The findings and recommendations were discussed  with                        the patient's family. Manya Silvas, MD 07/26/2017 8:57:12 AM This report has been signed electronically. Number of Addenda: 0 Note Initiated On: 07/26/2017 8:27 AM      [facility]

## 2017-07-26 NOTE — Anesthesia Preprocedure Evaluation (Signed)
Anesthesia Evaluation  Patient identified by MRN, date of birth, ID band Patient awake    Reviewed: Allergy & Precautions, NPO status , Patient's Chart, lab work & pertinent test results  History of Anesthesia Complications (+) PONV and history of anesthetic complications  Airway Mallampati: III  TM Distance: >3 FB Neck ROM: Full    Dental no notable dental hx.    Pulmonary asthma (mild intermittent) , neg sleep apnea, neg COPD,    breath sounds clear to auscultation- rhonchi (-) wheezing      Cardiovascular Exercise Tolerance: Good (-) hypertension(-) angina(-) CAD, (-) Past MI and (-) Cardiac Stents  Rhythm:Regular Rate:Normal - Systolic murmurs and - Diastolic murmurs    Neuro/Psych  Headaches, Anxiety    GI/Hepatic negative GI ROS, Neg liver ROS,   Endo/Other  negative endocrine ROSneg diabetes  Renal/GU negative Renal ROS     Musculoskeletal  (+) Arthritis ,   Abdominal (+) + obese,   Peds  Hematology negative hematology ROS (+)   Anesthesia Other Findings Past Medical History: No date: Anxiety No date: Asthma No date: Complication of anesthesia     Comment:  nausea No date: Headache No date: Urinary incontinence   Reproductive/Obstetrics                             Anesthesia Physical Anesthesia Plan  ASA: II  Anesthesia Plan: General   Post-op Pain Management:    Induction: Intravenous  PONV Risk Score and Plan: 3 and Propofol infusion  Airway Management Planned: Natural Airway  Additional Equipment:   Intra-op Plan:   Post-operative Plan:   Informed Consent: I have reviewed the patients History and Physical, chart, labs and discussed the procedure including the risks, benefits and alternatives for the proposed anesthesia with the patient or authorized representative who has indicated his/her understanding and acceptance.   Dental advisory given  Plan  Discussed with: CRNA and Anesthesiologist  Anesthesia Plan Comments:         Anesthesia Quick Evaluation

## 2017-07-26 NOTE — Anesthesia Post-op Follow-up Note (Signed)
Anesthesia QCDR form completed.        

## 2017-07-28 ENCOUNTER — Encounter: Payer: Self-pay | Admitting: Unknown Physician Specialty

## 2017-07-29 LAB — SURGICAL PATHOLOGY

## 2017-07-30 ENCOUNTER — Other Ambulatory Visit: Payer: Self-pay | Admitting: Obstetrics and Gynecology

## 2017-08-13 ENCOUNTER — Other Ambulatory Visit: Payer: Self-pay | Admitting: Obstetrics and Gynecology

## 2017-08-27 IMAGING — CT CT PELVIS W/O CM
2 of 3 series · 17 of 46 positions shown, 19 images · non-contrast
Comparison: CT scan of July 03, 2016.

CLINICAL DATA: Bilateral hip pain after fall several months ago.

EXAM:
CT PELVIS WITHOUT CONTRAST
TECHNIQUE: Multidetector CT imaging of the pelvis was performed following the
standard protocol without intravenous contrast.

[Series 3: axial st · axial · 0.72mm/px · z∈[-435,-201]mm · 14 of 135 slices shown, 16 images]
[im 9/135  soft-tissue]
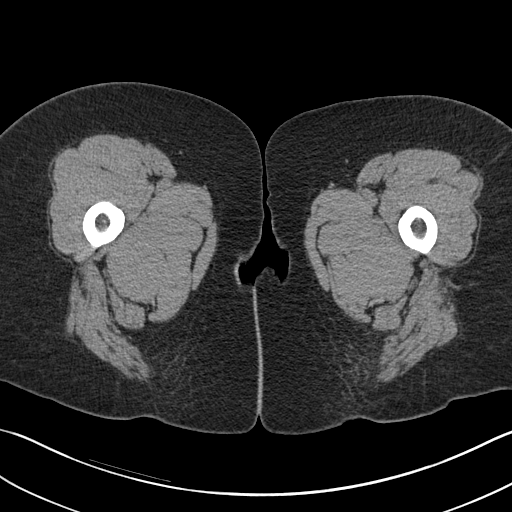
[im 9/135  bone]
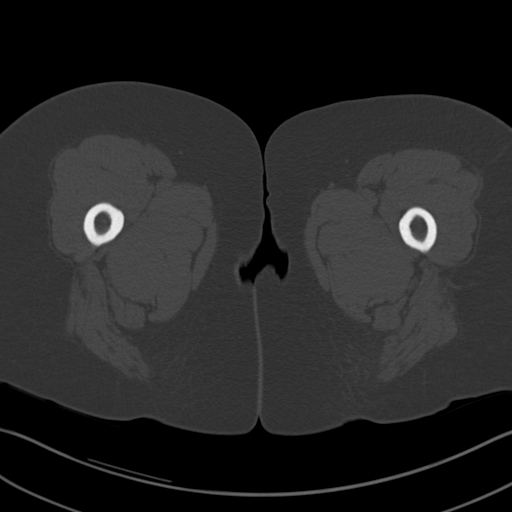
[im 18/135  soft-tissue]
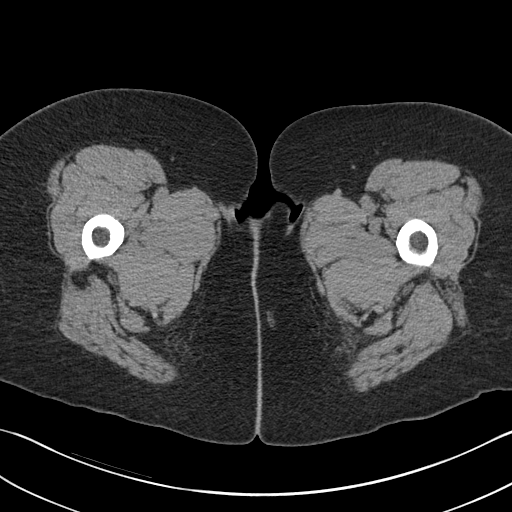
[im 26/135  soft-tissue]
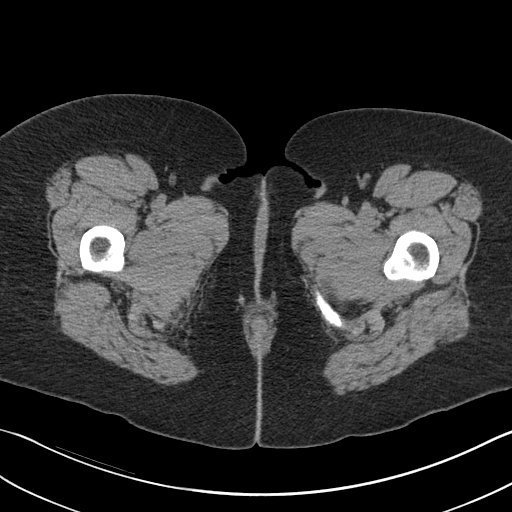
[im 35/135  soft-tissue]
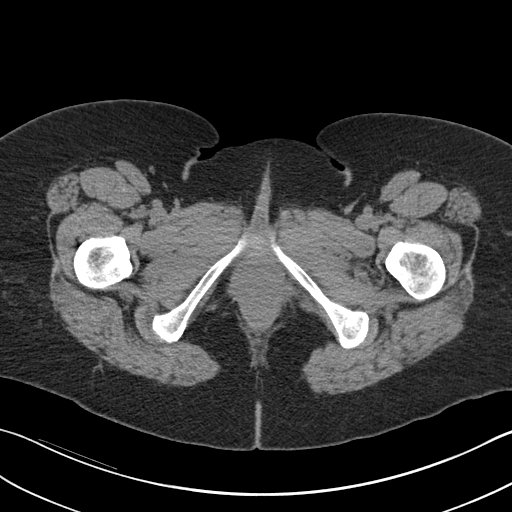
[im 44/135  soft-tissue]
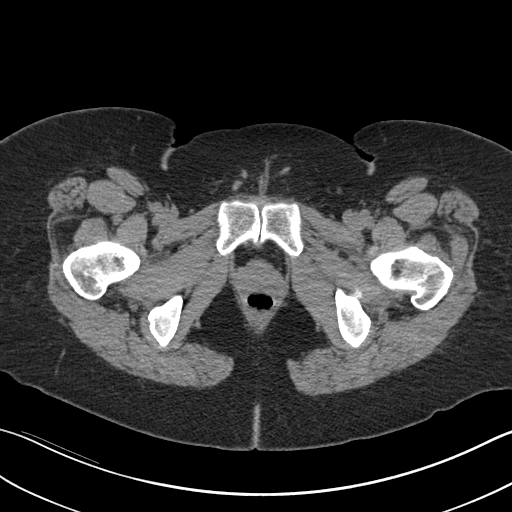
[im 52/135  soft-tissue]
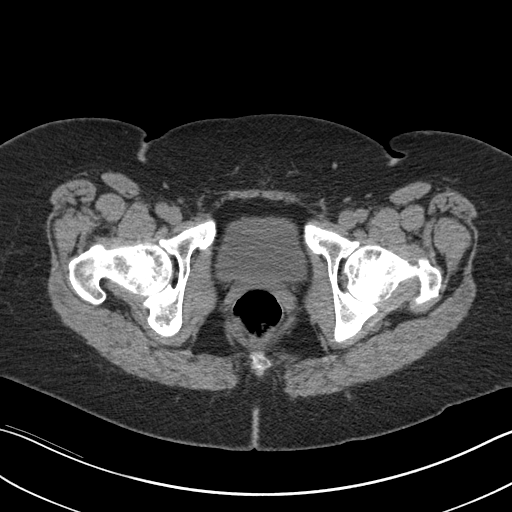
[im 61/135  soft-tissue]
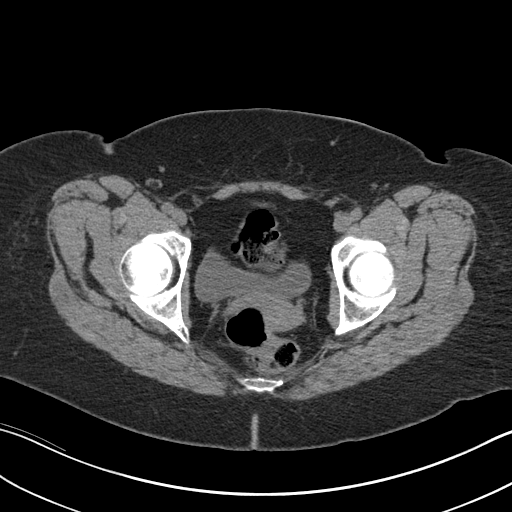
[im 74/135  soft-tissue]
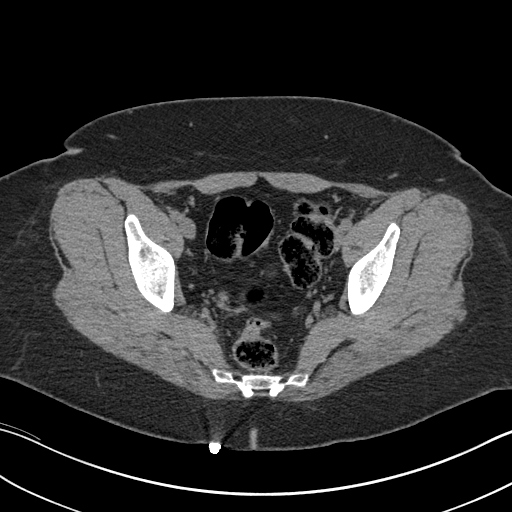
[im 83/135  soft-tissue]
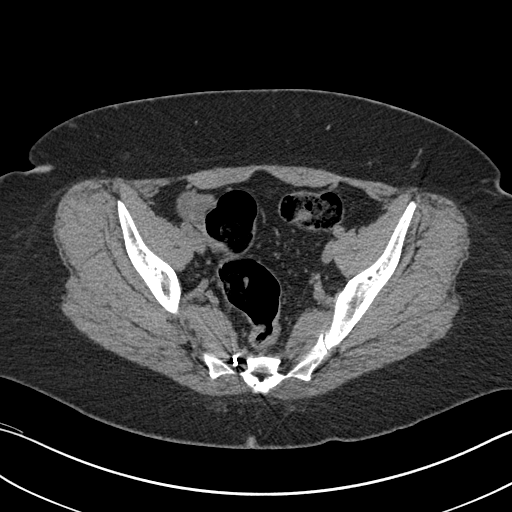
[im 83/135  bone]
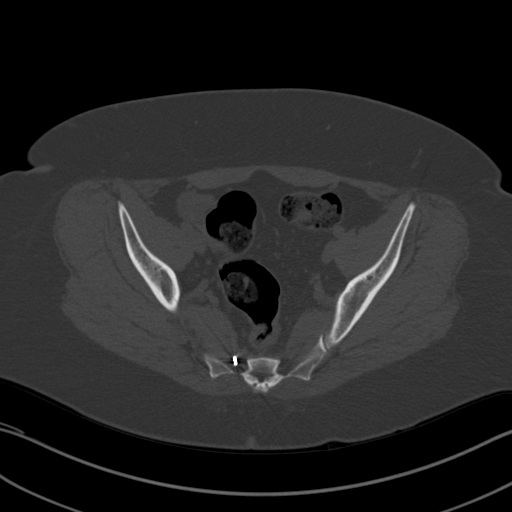
[im 91/135  soft-tissue]
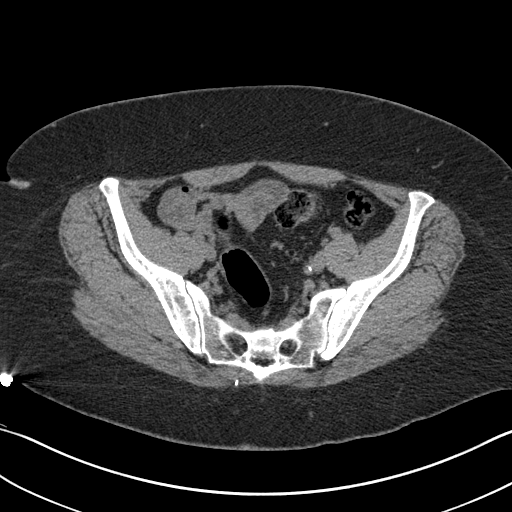
[im 100/135  soft-tissue]
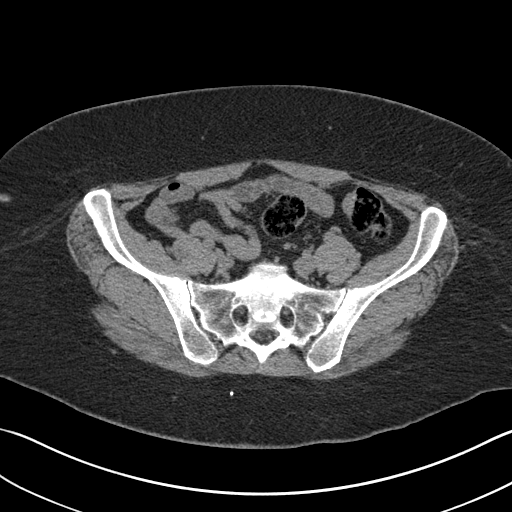
[im 109/135  soft-tissue]
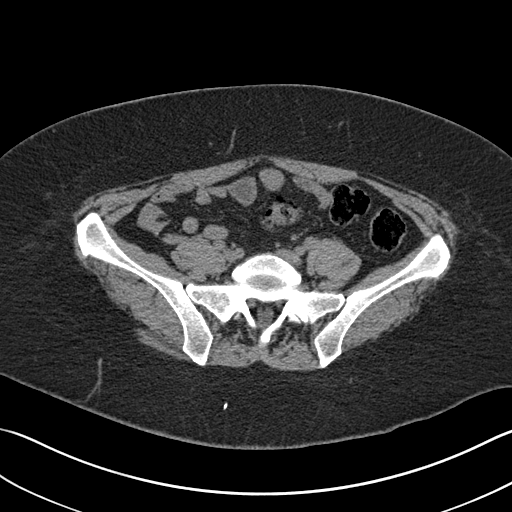
[im 117/135  soft-tissue]
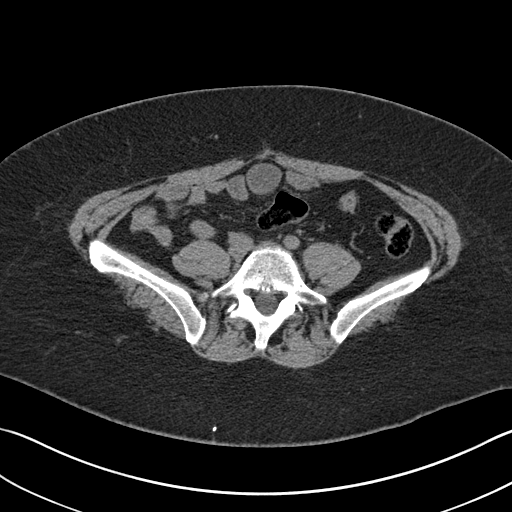
[im 126/135  soft-tissue]
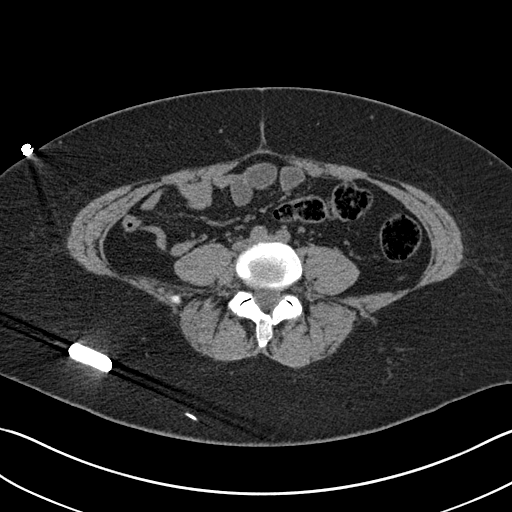

[Series 8: coronal st · coronal · 0.58mm/px · 3 of 104 slices shown]
[im 35/104  soft-tissue]
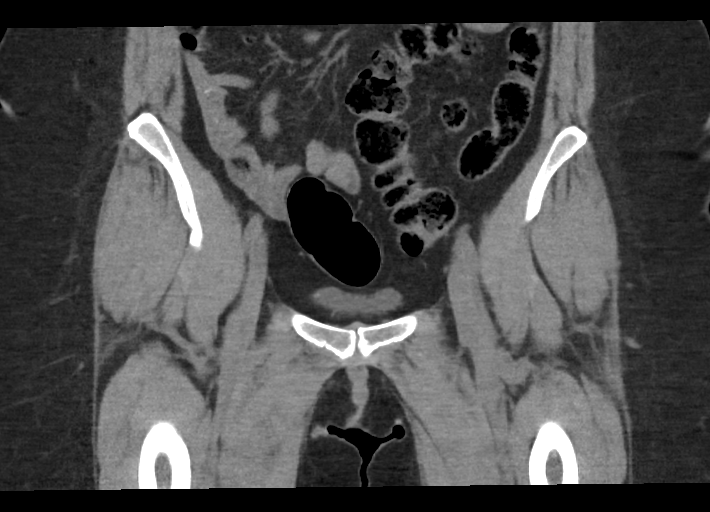
[im 46/104  soft-tissue]
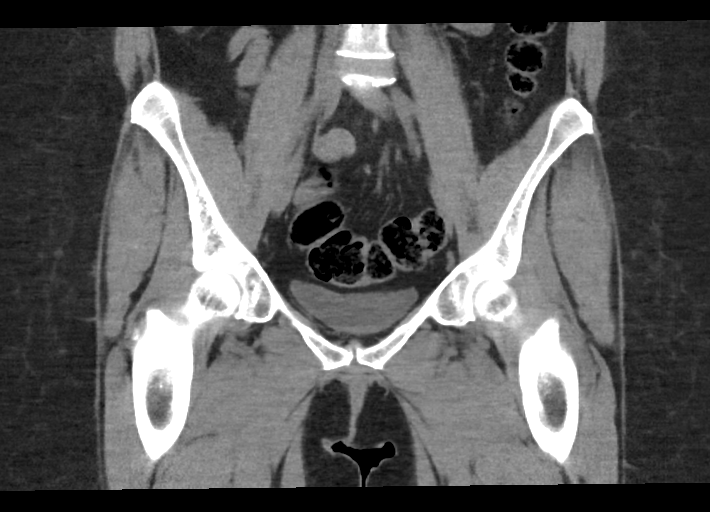
[im 58/104  soft-tissue]
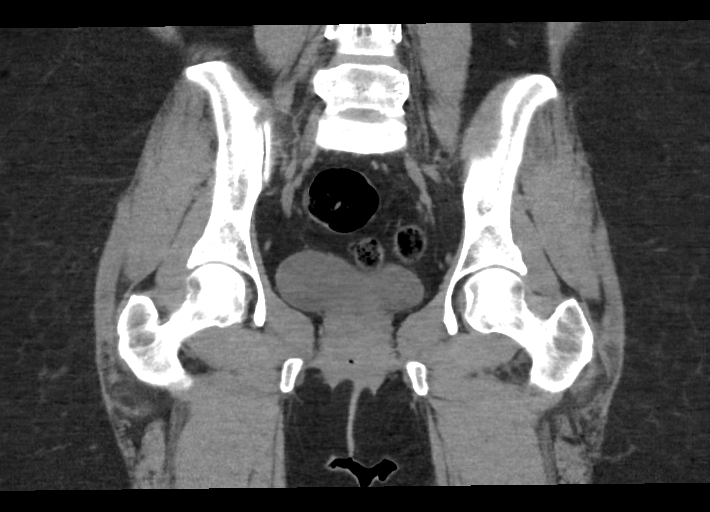

[17 of 46 positions shown; findings below may reference images not displayed]

FINDINGS: Urinary Tract:  No abnormality visualized.

Bowel:  Unremarkable visualized pelvic bowel loops.

Vascular/Lymphatic: No pathologically enlarged lymph nodes. No
significant vascular abnormality seen.

Reproductive:  No mass or other significant abnormality.

Other:  None.

Musculoskeletal: No fracture is noted. Stimulator lead is seen
entering right sacrum. Hip and sacroiliac joints appear normal.
IMPRESSION: No significant abnormality seen in the pelvis.

## 2017-09-11 ENCOUNTER — Other Ambulatory Visit: Payer: Self-pay | Admitting: Obstetrics and Gynecology

## 2017-09-13 ENCOUNTER — Encounter: Payer: Self-pay | Admitting: Obstetrics and Gynecology

## 2017-09-13 ENCOUNTER — Other Ambulatory Visit: Payer: Self-pay | Admitting: *Deleted

## 2017-09-13 MED ORDER — NITROFURANTOIN MONOHYD MACRO 100 MG PO CAPS: 100.0000 mg | ORAL_CAPSULE | Freq: Two times a day (BID) | ORAL | 0 refills | Status: DC

## 2017-09-18 ENCOUNTER — Ambulatory Visit: Payer: 59 | Admitting: Podiatry

## 2017-09-18 ENCOUNTER — Encounter: Payer: Self-pay | Admitting: Podiatry

## 2017-09-18 ENCOUNTER — Other Ambulatory Visit: Payer: 59 | Admitting: Orthotics

## 2017-09-18 DIAGNOSIS — M722 Plantar fascial fibromatosis: Secondary | ICD-10-CM | POA: Diagnosis not present

## 2017-09-18 NOTE — Progress Notes (Signed)
She presents today for flareup of her tendinitis and plantar fasciitis bilaterally.  States that it started hurting again this past week but feet feel like they are going to give out.  She is requesting injections.  She will also see Liliane Channel today for scanning for orthotics.  Objective: Vital signs are stable she is alert and oriented x3.  Pulses are palpable.  She has pain to palpation medial calcaneal tubercles bilateral.  No calf pain.  No open lesions no wounds.  Assessment: Fasciitis bilateral chronic in nature.  Plan: Discussed etiology pathology conservative versus surgical therapies.  After sterile Betadine skin prep injected 20 mg Kenalog 5 mg Marcaine to each bilateral heel medial aspect of the point of maximal tenderness of plantar fascial calcaneal insertion site.  Tolerated procedure well without complications.  She was scanned for orthotics today.  She lost her job today has recently had to put her dog down and her husband walked out on her 3 weeks ago.  I imagine stress she is under is also causing psychological pain.

## 2017-10-09 ENCOUNTER — Other Ambulatory Visit: Payer: Self-pay | Admitting: Obstetrics and Gynecology

## 2017-10-16 ENCOUNTER — Ambulatory Visit: Payer: 59 | Admitting: Orthotics

## 2017-10-16 ENCOUNTER — Other Ambulatory Visit: Payer: Self-pay | Admitting: Obstetrics and Gynecology

## 2017-10-16 DIAGNOSIS — M722 Plantar fascial fibromatosis: Secondary | ICD-10-CM

## 2017-10-16 NOTE — Progress Notes (Signed)
r Patient came in today to pick up custom made foot orthotics.  The goals were accomplished and the patient reported no dissatisfaction with said orthotics.  Patient was advised of breakin period and how to report any issues.

## 2017-10-17 ENCOUNTER — Ambulatory Visit (INDEPENDENT_AMBULATORY_CARE_PROVIDER_SITE_OTHER): Payer: Self-pay | Admitting: Family Medicine

## 2017-10-17 ENCOUNTER — Other Ambulatory Visit: Payer: Self-pay | Admitting: Obstetrics and Gynecology

## 2017-10-17 VITALS — BP 120/88 | HR 86 | Temp 98.2°F | Wt 210.0 lb

## 2017-10-17 DIAGNOSIS — J069 Acute upper respiratory infection, unspecified: Secondary | ICD-10-CM

## 2017-10-17 MED ORDER — LEVOCETIRIZINE DIHYDROCHLORIDE 5 MG PO TABS: 5.0000 mg | ORAL_TABLET | Freq: Every evening | ORAL | 1 refills | Status: AC

## 2017-10-17 MED ORDER — CEFDINIR 300 MG PO CAPS: 300.0000 mg | ORAL_CAPSULE | Freq: Two times a day (BID) | ORAL | 0 refills | Status: DC

## 2017-10-17 NOTE — Progress Notes (Signed)
Patient ID: Erika Shelton, female    DOB: Apr 25, 1966, 52 y.o.   MRN: [id number]  PCP: Joylene Igo, CNM  Chief Complaint  Patient presents with  . choice-chest cong/cough    Subjective:  HPI Erika Shelton is a 52 y.o. female presents for evaluation chest congestion and cough x 6-7 days. Symptoms include gradually worsening cough, nasal congestion, chest congestion, mild wheezing (improved with albuterol) and mild shortness of breath with activity. Denies fever, chest tightness, headache, body aches, sore throat, or facial pain. She attempted relief with a partial dose of left over antibiotics which initially improved symptoms. She has also used her albuterol inhaler for management of wheezing.  Social History   Socioeconomic History  . Marital status: Married    Spouse name: Not on file  . Number of children: Not on file  . Years of education: Not on file  . Highest education level: Not on file  Occupational History  . Not on file  Social Needs  . Financial resource strain: Not on file  . Food insecurity:    Worry: Not on file    Inability: Not on file  . Transportation needs:    Medical: Not on file    Non-medical: Not on file  Tobacco Use  . Smoking status: Never Smoker  . Smokeless tobacco: Never Used  Substance and Sexual Activity  . Alcohol use: No    Comment: OCCASIONALLY  . Drug use: No  . Sexual activity: Yes    Birth control/protection: Surgical  Lifestyle  . Physical activity:    Days per week: Not on file    Minutes per session: Not on file  . Stress: Not on file  Relationships  . Social connections:    Talks on phone: Not on file    Gets together: Not on file    Attends religious service: Not on file    Active member of club or organization: Not on file    Attends meetings of clubs or organizations: Not on file    Relationship status: Not on file  . Intimate partner violence:    Fear of current or ex partner: Not on file    Emotionally  abused: Not on file    Physically abused: Not on file    Forced sexual activity: Not on file  Other Topics Concern  . Not on file  Social History Narrative  . Not on file    Family History  Problem Relation Age of Onset  . Arthritis Mother   . Asthma Mother   . COPD Mother   . Depression Mother   . Heart disease Mother   . Hyperlipidemia Mother   . Hypertension Mother   . Cancer Father   . Breast cancer Maternal Aunt 74   Review of Systems  Constitutional: Positive for fatigue.  Eyes: Negative.   Respiratory: Positive for cough, shortness of breath, wheezing and stridor.   Cardiovascular: Negative.   Gastrointestinal: Negative.   Genitourinary: Negative.   Musculoskeletal: Negative.   Skin: Negative.   Hematological: Negative.   Psychiatric/Behavioral: Negative.     Patient Active Problem List   Diagnosis Date Noted  . Morbid obesity ([place]) 12/07/2014  . DDD (degenerative disc disease), lumbar 11/23/2014  . Facet syndrome, lumbar 11/23/2014  . Sacroiliac joint dysfunction 11/23/2014  . Lumbar radiculopathy 11/23/2014  . Mixed hyperlipidemia 07/29/2014  . OSA (obstructive sleep apnea) 07/29/2014  . Chest pain 01/22/2014  . Lightheaded 01/22/2014  . SOB (shortness of breath)  01/22/2014  . Tachycardia 01/22/2014    Allergies  Allergen Reactions  . Ciprofloxacin Nausea And Vomiting  . Augmentin [Amoxicillin-Pot Clavulanate]   . Lexapro [Escitalopram Oxalate]   . Tetracyclines & Related   . Darvocet [Propoxyphene N-Acetaminophen] Itching  . Percocet [Oxycodone-Acetaminophen] Itching  . Vicodin [Hydrocodone-Acetaminophen] Itching    Prior to Admission medications   Medication Sig Start Date End Date Taking? Authorizing Provider  albuterol (PROVENTIL HFA;VENTOLIN HFA) 108 (90 Base) MCG/ACT inhaler Inhale 2 puffs into the lungs every 6 (six) hours as needed for wheezing or shortness of breath. 03/21/17  Yes Shambley, Melody N, CNM  cyanocobalamin (,VITAMIN B-12,)  1000 MCG/ML injection Inject 1 mL (1,000 mcg total) into the muscle every 30 (thirty) days. 04/18/16  Yes Shambley, Melody N, CNM  estradiol (ESTRACE) 0.1 MG/GM vaginal cream PLACE 2 GRAMS VAGINALLY DAILY. 07/03/17  Yes Shambley, Melody N, CNM  levothyroxine (SYNTHROID, LEVOTHROID) 50 MCG tablet TAKE 1 TABLET BY MOUTH DAILY BEFORE BREAKFAST. 07/30/17  Yes Shambley, Melody N, CNM  LORazepam (ATIVAN) 2 MG tablet Take 1 tablet (2 mg total) by mouth daily. 10/08/16  Yes Shambley, Melody N, CNM  meloxicam (MOBIC) 15 MG tablet Take 1 tablet (15 mg total) by mouth daily. 07/22/17  Yes Hyatt, Max T, DPM  Methen-Hyosc-Meth Blue-Na Phos (UROGESIC-BLUE) 81.6 MG TABS TAKE 1 TABLET BY MOUTH EVERY 6 HOURS AS NEEDED 07/03/17  Yes Shambley, Melody N, CNM  montelukast (SINGULAIR) 10 MG tablet Take 1 tablet (10 mg total) by mouth at bedtime. 03/21/17  Yes Shambley, Melody N, CNM  progesterone (PROMETRIUM) 200 MG capsule TAKE 1 CAPSULE BY MOUTH ONCE DAILY 04/24/17  Yes Shambley, Melody N, CNM  venlafaxine XR (EFFEXOR-XR) 75 MG 24 hr capsule TAKE 1 CAPSULE BY MOUTH 2 TIMES DAILY. 09/11/17  Yes Shambley, Melody N, CNM  Vitamin D, Ergocalciferol, (DRISDOL) 50000 units CAPS capsule Take 1 capsule (50,000 Units total) by mouth every 7 (seven) days. 10/02/16  Yes Shambley, Melody N, CNM  zolpidem (AMBIEN) 10 MG tablet TAKE ONE TABLET BY MOUTH EACH NIGHT AT BEDTIME 07/03/17  Yes Shambley, Melody N, CNM  ALPRAZolam (XANAX) 0.5 MG tablet Take 1 tablet (0.5 mg total) by mouth 3 (three) times daily as needed for anxiety. Patient not taking: Reported on 10/17/2017 11/07/15   Joylene Igo, CNM  cefdinir (OMNICEF) 300 MG capsule Take 1 capsule (300 mg total) by mouth 2 (two) times daily. 10/17/17   Scot Jun, FNP  levocetirizine (XYZAL) 5 MG tablet Take 1 tablet (5 mg total) by mouth every evening. 10/17/17   Scot Jun, FNP  LORazepam (ATIVAN) 2 MG tablet TAKE 1 TABLET BY MOUTH DAILY Patient not taking: Reported on 10/17/2017  05/28/17   Shambley, Melody N, CNM  LORazepam (ATIVAN) 2 MG tablet TAKE 1 TABLET BY MOUTH ONCE DAILY Patient not taking: Reported on 10/17/2017 10/09/17   Shambley, Melody N, CNM  methocarbamol (ROBAXIN) 500 MG tablet Take 1 tablet (500 mg total) by mouth 4 (four) times daily. Patient not taking: Reported on 10/17/2017 05/09/16   Cuthriell, Charline Bills, PA-C  nitrofurantoin, macrocrystal-monohydrate, (MACROBID) 100 MG capsule Take 1 capsule (100 mg total) by mouth 2 (two) times daily. Patient not taking: Reported on 10/17/2017 09/13/17   Shambley, Melody N, CNM  Vitamin D, Ergocalciferol, (DRISDOL) 50000 units CAPS capsule TAKE 1 CAPSULE BY MOUTH EVERY 7 DAYS. Patient not taking: Reported on 10/17/2017 10/17/17   Joylene Igo, CNM    Past Medical, Surgical Family and Social History reviewed and  updated.    Objective:   Today's Vitals   10/17/17 1655  BP: 120/88  Pulse: 86  Temp: 98.2 F (36.8 C)  SpO2: 98%  Weight: 210 lb (95.3 kg)    Wt Readings from Last 3 Encounters:  10/17/17 210 lb (95.3 kg)  07/26/17 212 lb (96.2 kg)  04/05/17 217 lb 4.8 oz (98.6 kg)    Physical Exam  Constitutional: She appears well-developed and well-nourished.  Non-toxic appearance. She does not have a sickly appearance. She does not appear ill. No distress.  HENT:  Head: Normocephalic.  Right Ear: External ear normal.  Left Ear: External ear normal.  Nose: Mucosal edema and rhinorrhea present.  Cardiovascular: Normal rate, regular rhythm and normal heart sounds.  Pulmonary/Chest: Breath sounds normal. She has no wheezes. She has no rales. She exhibits no tenderness.  Barky type persistent cough noted throughout exam. Mild shortness of breath noted during ausculation   Neurological: She is alert.  Skin: Skin is warm and dry. She is not diaphoretic.     Assessment & Plan:  1. Upper respiratory tract infection, unspecified type Course of illness approaching 7 days with gradually worsening  symptoms. Worrisome cough noted during exam. She has no history of asthma or smoking. Patient has a penicillin allergy, although has taken and tolerated cephalosporins in the past.  Recommended OTC Delsym for cough management and continue currently prescribed albuterol inhaler for management of shortness of breath and wheezing. See medication orders:  Meds ordered this encounter  Medications  . cefdinir (OMNICEF) 300 MG capsule    Sig: Take 1 capsule (300 mg total) by mouth 2 (two) times daily.    Dispense:  20 capsule    Refill:  0  . levocetirizine (XYZAL) 5 MG tablet    Sig: Take 1 tablet (5 mg total) by mouth every evening.    Dispense:  90 tablet    Refill:  1   If symptoms worsen or do not improve, return for follow-up, follow-up with PCP, or at the emergency department if severity of symptoms warrant a higher level of care.   Carroll Sage. Kenton Kingfisher, MSN, FNP-C [place], [place] [id number]

## 2017-10-17 NOTE — Patient Instructions (Addendum)
I have prescribed Omnicef 300 mg twice daily x 10 days.  For cough, I recommend Delsym over the counter cold and cough preparation.  Continue Singulair however I am adding Xyzal once daily at bedtime 5 mg for its antihistamine action you may take as needed once symptoms resolved.  Continue use of your albuterol inhaler for shortness of breath and wheezing. If symptoms worsen and do not improve return to clinic or follow-up with your PCP.    Upper Respiratory Infection, Adult Most upper respiratory infections (URIs) are caused by a virus. A URI affects the nose, throat, and upper air passages. The most common type of URI is often called "the common cold." Follow these instructions at home:  Take medicines only as told by your doctor.  Gargle warm saltwater or take cough drops to comfort your throat as told by your doctor.  Use a warm mist humidifier or inhale steam from a shower to increase air moisture. This may make it easier to breathe.  Drink enough fluid to keep your pee (urine) clear or pale yellow.  Eat soups and other clear broths.  Have a healthy diet.  Rest as needed.  Go back to work when your fever is gone or your doctor says it is okay. ? You may need to stay home longer to avoid giving your URI to others. ? You can also wear a face mask and wash your hands often to prevent spread of the virus.  Use your inhaler more if you have asthma.  Do not use any tobacco products, including cigarettes, chewing tobacco, or electronic cigarettes. If you need help quitting, ask your doctor. Contact a doctor if:  You are getting worse, not better.  Your symptoms are not helped by medicine.  You have chills.  You are getting more short of breath.  You have brown or red mucus.  You have yellow or brown discharge from your nose.  You have pain in your face, especially when you bend forward.  You have a fever.  You have puffy (swollen) neck glands.  You have pain while  swallowing.  You have white areas in the back of your throat. Get help right away if:  You have very bad or constant: ? Headache. ? Ear pain. ? Pain in your forehead, behind your eyes, and over your cheekbones (sinus pain). ? Chest pain.  You have long-lasting (chronic) lung disease and any of the following: ? Wheezing. ? Long-lasting cough. ? Coughing up blood. ? A change in your usual mucus.  You have a stiff neck.  You have changes in your: ? Vision. ? Hearing. ? Thinking. ? Mood. This information is not intended to replace advice given to you by your health care provider. Make sure you discuss any questions you have with your health care provider. Document Released: 11/21/2007 Document Revised: 02/05/2016 Document Reviewed: 09/09/2013 Elsevier Interactive Patient Education  2018 [employer].   Bronchospasm, Adult Bronchospasm is when airways in the lungs get smaller. When this happens, it can be hard to breathe. You may cough. You may also make a whistling sound when you breathe (wheeze). Follow these instructions at home: Medicines  Take over-the-counter and prescription medicines only as told by your doctor.  If you need to use an inhaler or nebulizer to take your medicine, ask your doctor how to use it.  If you were given a spacer, always use it with your inhaler. Lifestyle  Change your heating and air conditioning filter. Do this at  least once a month.  Try not to use fireplaces and wood stoves.  Do not  smoke. Do not  allow smoking in your home.  Try not to use things that have a strong smell, like perfume.  Get rid of pests (such as roaches and mice) and their poop.  Remove any mold from your home.  Keep your house clean. Get rid of dust.  Use cleaning products that have no smell.  Replace carpet with wood, tile, or vinyl flooring.  Use allergy-proof pillows, mattress covers, and box spring covers.  Wash bed sheets and blankets every week. Use  hot water. Dry them in a dryer.  Use blankets that are made of polyester or cotton.  Wash your hands often.  Keep pets out of your bedroom.  When you exercise, try not to breathe in cold air. General instructions  Have a plan for getting medical care. Know these things: ? When to call your doctor. ? When to call local emergency services (911 in the [place] ? Where to go in an emergency.  Stay up to date on your shots (immunizations).  When you have an episode: ? Stay calm. ? Relax. ? Breathe slowly. Contact a doctor if:  Your muscles ache.  Your chest hurts.  The color of the mucus you cough up (sputum) changes from clear or white to yellow, green, gray, or bloody.  The mucus you cough up gets thicker.  You have a fever. Get help right away if:  The whistling sound gets worse, even after you take your medicines.  Your coughing gets worse.  You find it even harder to breathe.  Your chest hurts very much. Summary  Bronchospasm is when airways in the lungs get smaller.  When this happens, it can be hard to breathe. You may cough. You may also make a whistling sound when you breathe.  Stay away from things that cause you to have episodes. These include smoke or dust. This information is not intended to replace advice given to you by your health care provider. Make sure you discuss any questions you have with your health care provider. Document Released: 04/01/2009 Document Revised: 06/07/2016 Document Reviewed: 06/07/2016 Elsevier Interactive Patient Education  2017 [employer].

## 2017-10-24 ENCOUNTER — Telehealth: Payer: Self-pay | Admitting: Emergency Medicine

## 2017-10-24 NOTE — Telephone Encounter (Signed)
Left message follow up call from [facility] visit 

## 2017-11-15 ENCOUNTER — Other Ambulatory Visit: Payer: Self-pay | Admitting: Obstetrics and Gynecology

## 2017-11-19 ENCOUNTER — Encounter: Payer: 59 | Admitting: Obstetrics and Gynecology

## 2017-11-19 ENCOUNTER — Telehealth: Payer: Self-pay | Admitting: *Deleted

## 2017-11-19 ENCOUNTER — Telehealth: Payer: Self-pay | Admitting: Obstetrics and Gynecology

## 2017-11-19 ENCOUNTER — Other Ambulatory Visit: Payer: Self-pay | Admitting: Obstetrics and Gynecology

## 2017-11-19 MED ORDER — BUPROPION HCL ER (XL) 150 MG PO TB24: 150.0000 mg | ORAL_TABLET | Freq: Every day | ORAL | 6 refills | Status: DC

## 2017-11-19 MED ORDER — LORAZEPAM 2 MG PO TABS: 2.0000 mg | ORAL_TABLET | Freq: Three times a day (TID) | ORAL | 1 refills | Status: DC | PRN

## 2017-11-19 NOTE — Telephone Encounter (Signed)
Pt walked in this am "states her depression is really bad, her husband left her, she lost her job and had to put her dog down, would like something for depression and her Lorazepam needs to be for tid

## 2017-11-19 NOTE — Telephone Encounter (Signed)
I sent in a prescription for wellbutrin and changed Lorazepem to 3 a day as needed. I want her to go for counseling with Ar[facility]. I can put it in as referral if she needs. Also want to see her in 4-5 weeks for medication check.

## 2017-11-19 NOTE — Telephone Encounter (Signed)
Called pt we discussed her medication

## 2017-11-19 NOTE — Telephone Encounter (Signed)
Patient needs to talk about her new medication and how to take it along with her existing medications.  Whether to stop any or when to start the new, etc.  Pt seeks advice.   Plz call this number [phone] and ask for her.  Please advise, thank you.

## 2018-01-06 ENCOUNTER — Telehealth: Payer: Self-pay | Admitting: Obstetrics and Gynecology

## 2018-01-06 NOTE — Telephone Encounter (Signed)
Patient is requesting to change pharmacy on file to [facility]; [facility] location. Please call in her medications here today; she is currently out.  Her best call back number is [phone]; new work number.  She just got insurance so this caused her to have to wait on her medications.  Please advise, thanks.

## 2018-01-07 ENCOUNTER — Other Ambulatory Visit: Payer: Self-pay | Admitting: *Deleted

## 2018-01-07 MED ORDER — PROGESTERONE MICRONIZED 200 MG PO CAPS: 200.0000 mg | ORAL_CAPSULE | Freq: Every day | ORAL | 1 refills | Status: DC

## 2018-01-07 NOTE — Telephone Encounter (Signed)
Done-ac 

## 2018-01-09 ENCOUNTER — Encounter: Payer: Self-pay | Admitting: Obstetrics and Gynecology

## 2018-01-09 ENCOUNTER — Ambulatory Visit (INDEPENDENT_AMBULATORY_CARE_PROVIDER_SITE_OTHER): Payer: 59 | Admitting: Obstetrics and Gynecology

## 2018-01-09 ENCOUNTER — Emergency Department: Payer: Commercial Managed Care - HMO

## 2018-01-09 ENCOUNTER — Emergency Department
Admission: EM | Admit: 2018-01-09 | Discharge: 2018-01-09 | Disposition: A | Discharge status: Home / Self Care | Payer: Commercial Managed Care - HMO | Attending: Emergency Medicine | Admitting: Emergency Medicine

## 2018-01-09 ENCOUNTER — Encounter: Payer: Self-pay | Admitting: Emergency Medicine

## 2018-01-09 ENCOUNTER — Other Ambulatory Visit: Payer: Self-pay

## 2018-01-09 DIAGNOSIS — W0110XA Fall on same level from slipping, tripping and stumbling with subsequent striking against unspecified object, initial encounter: Secondary | ICD-10-CM | POA: Insufficient documentation

## 2018-01-09 DIAGNOSIS — S161XXA Strain of muscle, fascia and tendon at neck level, initial encounter: Secondary | ICD-10-CM | POA: Diagnosis not present

## 2018-01-09 DIAGNOSIS — J45909 Unspecified asthma, uncomplicated: Secondary | ICD-10-CM | POA: Insufficient documentation

## 2018-01-09 DIAGNOSIS — M542 Cervicalgia: Secondary | ICD-10-CM | POA: Diagnosis not present

## 2018-01-09 DIAGNOSIS — Z23 Encounter for immunization: Secondary | ICD-10-CM | POA: Insufficient documentation

## 2018-01-09 DIAGNOSIS — S0993XA Unspecified injury of face, initial encounter: Secondary | ICD-10-CM | POA: Diagnosis not present

## 2018-01-09 DIAGNOSIS — S62001A Unspecified fracture of navicular [scaphoid] bone of right wrist, initial encounter for closed fracture: Secondary | ICD-10-CM | POA: Diagnosis not present

## 2018-01-09 DIAGNOSIS — S8992XA Unspecified injury of left lower leg, initial encounter: Secondary | ICD-10-CM

## 2018-01-09 DIAGNOSIS — W19XXXA Unspecified fall, initial encounter: Secondary | ICD-10-CM

## 2018-01-09 DIAGNOSIS — S6991XA Unspecified injury of right wrist, hand and finger(s), initial encounter: Secondary | ICD-10-CM | POA: Diagnosis not present

## 2018-01-09 DIAGNOSIS — Y929 Unspecified place or not applicable: Secondary | ICD-10-CM | POA: Diagnosis not present

## 2018-01-09 DIAGNOSIS — Y999 Unspecified external cause status: Secondary | ICD-10-CM | POA: Insufficient documentation

## 2018-01-09 DIAGNOSIS — Z79899 Other long term (current) drug therapy: Secondary | ICD-10-CM | POA: Insufficient documentation

## 2018-01-09 DIAGNOSIS — Y9301 Activity, walking, marching and hiking: Secondary | ICD-10-CM | POA: Diagnosis not present

## 2018-01-09 DIAGNOSIS — S0990XA Unspecified injury of head, initial encounter: Secondary | ICD-10-CM | POA: Diagnosis not present

## 2018-01-09 DIAGNOSIS — S59911A Unspecified injury of right forearm, initial encounter: Secondary | ICD-10-CM | POA: Diagnosis not present

## 2018-01-09 DIAGNOSIS — W101XXA Fall (on)(from) sidewalk curb, initial encounter: Secondary | ICD-10-CM

## 2018-01-09 DIAGNOSIS — S52121A Displaced fracture of head of right radius, initial encounter for closed fracture: Secondary | ICD-10-CM | POA: Insufficient documentation

## 2018-01-09 LAB — CBC WITH DIFFERENTIAL/PLATELET
BASOS ABS: 0.1 10*3/uL (ref 0–0.1)
Basophils Relative: 1 %
Eosinophils Absolute: 0.2 10*3/uL (ref 0–0.7)
Eosinophils Relative: 3 %
HEMATOCRIT: 38.7 % (ref 35.0–47.0)
HEMOGLOBIN: 13 g/dL (ref 12.0–16.0)
LYMPHS ABS: 1.8 10*3/uL (ref 1.0–3.6)
Lymphocytes Relative: 21 %
MCH: 29.1 pg (ref 26.0–34.0)
MCHC: 33.5 g/dL (ref 32.0–36.0)
MCV: 86.7 fL (ref 80.0–100.0)
Monocytes Absolute: 0.9 10*3/uL (ref 0.2–0.9)
Monocytes Relative: 10 %
NEUTROS PCT: 65 %
Neutro Abs: 5.8 10*3/uL (ref 1.4–6.5)
Platelets: 309 10*3/uL (ref 150–440)
RBC: 4.46 MIL/uL (ref 3.80–5.20)
RDW: 14.4 % (ref 11.5–14.5)
WBC: 8.7 10*3/uL (ref 3.6–11.0)

## 2018-01-09 LAB — BASIC METABOLIC PANEL
ANION GAP: 6 (ref 5–15)
BUN: 10 mg/dL (ref 6–20)
CALCIUM: 9.2 mg/dL (ref 8.9–10.3)
CO2: 29 mmol/L (ref 22–32)
Chloride: 106 mmol/L (ref 98–111)
Creatinine, Ser: 0.77 mg/dL (ref 0.44–1.00)
GFR calc Af Amer: >60 mL/min (ref >60)
GLUCOSE: 100 mg/dL — AB (ref 70–99)
Potassium: 3.9 mmol/L (ref 3.5–5.1)
SODIUM: 141 mmol/L (ref 135–145)

## 2018-01-09 LAB — URINALYSIS, COMPLETE (UACMP) WITH MICROSCOPIC
BACTERIA UA: NONE SEEN
Bilirubin Urine: NEGATIVE
GLUCOSE, UA: NEGATIVE mg/dL
Hgb urine dipstick: NEGATIVE
KETONES UR: NEGATIVE mg/dL
Leukocytes, UA: NEGATIVE
Nitrite: NEGATIVE
PROTEIN: NEGATIVE mg/dL
Specific Gravity, Urine: 1.013 (ref 1.005–1.030)
pH: 5 (ref 5.0–8.0)

## 2018-01-09 MED ORDER — ONDANSETRON HCL 4 MG/2ML IJ SOLN
4.0000 mg | Freq: Once | INTRAMUSCULAR | Status: AC
  Administered 2018-01-09: 4 mg via INTRAVENOUS
  Filled 2018-01-09: qty 2

## 2018-01-09 MED ORDER — MELOXICAM 15 MG PO TABS: 15.0000 mg | ORAL_TABLET | Freq: Every day | ORAL | 2 refills | Status: DC

## 2018-01-09 MED ORDER — OXYCODONE-ACETAMINOPHEN 5-325 MG PO TABS: 1.0000 | ORAL_TABLET | ORAL | 0 refills | Status: AC | PRN

## 2018-01-09 MED ORDER — DIPHENHYDRAMINE HCL 50 MG/ML IJ SOLN
25.0000 mg | Freq: Once | INTRAMUSCULAR | Status: AC
  Administered 2018-01-09: 25 mg via INTRAVENOUS
  Filled 2018-01-09: qty 1

## 2018-01-09 MED ORDER — TETANUS-DIPHTH-ACELL PERTUSSIS 5-2.5-18.5 LF-MCG/0.5 IM SUSP
0.5000 mL | Freq: Once | INTRAMUSCULAR | Status: AC
  Administered 2018-01-09: 0.5 mL via INTRAMUSCULAR
  Filled 2018-01-09 (×2): qty 0.5

## 2018-01-09 MED ORDER — OXYCODONE-ACETAMINOPHEN 5-325 MG PO TABS
ORAL_TABLET | ORAL | Status: AC
  Filled 2018-01-09: qty 1

## 2018-01-09 MED ORDER — OXYCODONE-ACETAMINOPHEN 5-325 MG PO TABS
1.0000 | ORAL_TABLET | Freq: Once | ORAL | Status: AC
  Administered 2018-01-09: 1 via ORAL

## 2018-01-09 MED ORDER — MORPHINE SULFATE (PF) 4 MG/ML IV SOLN
4.0000 mg | Freq: Once | INTRAVENOUS | Status: AC
  Administered 2018-01-09: 4 mg via INTRAVENOUS
  Filled 2018-01-09: qty 1

## 2018-01-09 NOTE — ED Notes (Signed)
Pt had an appt at her pcp's office - fell and scraped her left knee, bridge of nose and states rt arm pain. Was seeing dr for left sided neck pain.

## 2018-01-09 NOTE — ED Provider Notes (Signed)
[facility] Emergency Department Provider Note  ____________________________________________   First MD Initiated Contact with Patient 01/09/18 1623     (approximate)  I have reviewed the triage vital signs and the nursing notes.   HISTORY  Chief Complaint Fall    HPI Erika Shelton is a 52 y.o. female presents emergency department after a fall at [facility].  She states she was walking into the building and fell landing on an outstretched hand, her knee and hit her face.  She states she has a bad headache, has swelling to the front of her face, her neck hurts, her right arm hurts at the wrist and elbow, and her left knee hurts.  She denies loss consciousness.  Denies chest pain or shortness of breath.  She is unsure of her last tetanus.  She was going to [facility] to be evaluated for a UTI and anemia.  Patient states she can take oxycodone or hydrocodone for pain but will have to have Benadryl with it.  Past Medical History:  Diagnosis Date  . Anxiety   . Asthma   . Complication of anesthesia    nausea  . Headache   . Urinary incontinence     Patient Active Problem List   Diagnosis Date Noted  . Morbid obesity ([place]) 12/07/2014  . DDD (degenerative disc disease), lumbar 11/23/2014  . Facet syndrome, lumbar 11/23/2014  . Sacroiliac joint dysfunction 11/23/2014  . Lumbar radiculopathy 11/23/2014  . Mixed hyperlipidemia 07/29/2014  . OSA (obstructive sleep apnea) 07/29/2014  . Chest pain 01/22/2014  . Lightheaded 01/22/2014  . SOB (shortness of breath) 01/22/2014  . Tachycardia 01/22/2014    Past Surgical History:  Procedure Laterality Date  . BLADDER SURGERY     X2   . COLONOSCOPY WITH PROPOFOL N/A 07/26/2017   Procedure: COLONOSCOPY WITH PROPOFOL;  Surgeon: Manya Silvas, MD;  Location: [facility] ENDOSCOPY;  Service: Endoscopy;  Laterality: N/A;  . ESOPHAGOGASTRODUODENOSCOPY (EGD) WITH PROPOFOL N/A 07/26/2017   Procedure:  ESOPHAGOGASTRODUODENOSCOPY (EGD) WITH PROPOFOL;  Surgeon: Manya Silvas, MD;  Location[facility] ENDOSCOPY;  Service: Endoscopy;  Laterality: N/A;  . FOOT SURGERY Bilateral   . interstem     . LAPAROSCOPIC GASTRIC BANDING    . LAPAROSCOPIC GASTRIC RESTRICTIVE DUODENAL PROCEDURE (DUODENAL SWITCH) N/A 12/07/2014   Procedure: LAPAROSCOPIC GASTRIC RESTRICTIVE DUODENAL PROCEDURE (DUODENAL SWITCH);  Surgeon: Bonner Puna, MD;  Location: [facility] ORS;  Service: General;  Laterality: N/A;  . LAPAROSCOPIC GASTRIC SLEEVE RESECTION    . VAGINAL HYSTERECTOMY      Prior to Admission medications   Medication Sig Start Date End Date Taking? Authorizing Provider  albuterol (PROVENTIL HFA;VENTOLIN HFA) 108 (90 Base) MCG/ACT inhaler Inhale 2 puffs into the lungs every 6 (six) hours as needed for wheezing or shortness of breath. 03/21/17   Shambley, Melody N, CNM  ALPRAZolam (XANAX) 0.5 MG tablet Take 1 tablet (0.5 mg total) by mouth 3 (three) times daily as needed for anxiety. Patient not taking: Reported on 10/17/2017 11/07/15   Joylene Igo, CNM  buPROPion (WELLBUTRIN XL) 150 MG 24 hr tablet Take 1 tablet (150 mg total) by mouth daily. 11/19/17   Shambley, Melody N, CNM  cefdinir (OMNICEF) 300 MG capsule Take 1 capsule (300 mg total) by mouth 2 (two) times daily. 10/17/17   Scot Jun, FNP  cyanocobalamin (,VITAMIN B-12,) 1000 MCG/ML injection Inject 1 mL (1,000 mcg total) into the muscle every 30 (thirty) days. 04/18/16   Joylene Igo, CNM  estradiol (ESTRACE) 0.1 MG/GM vaginal cream PLACE 2 GRAMS VAGINALLY DAILY. 10/18/17   Shambley, Melody N, CNM  levocetirizine (XYZAL) 5 MG tablet Take 1 tablet (5 mg total) by mouth every evening. 10/17/17   Scot Jun, FNP  levothyroxine (SYNTHROID, LEVOTHROID) 50 MCG tablet TAKE 1 TABLET BY MOUTH DAILY BEFORE BREAKFAST. 07/30/17   Shambley, Melody N, CNM  LORazepam (ATIVAN) 2 MG tablet Take 1 tablet (2 mg total) by mouth every 8 (eight) hours as needed for  anxiety. 11/19/17   Shambley, Melody N, CNM  meloxicam (MOBIC) 15 MG tablet Take 1 tablet (15 mg total) by mouth daily. 01/09/18 01/09/19  Caryn Section, Linden Dolin, PA-C  Methen-Hyosc-Meth Blue-Na Phos (UROGESIC-BLUE) 81.6 MG TABS TAKE 1 TABLET BY MOUTH EVERY 6 HOURS AS NEEDED 07/03/17   Shambley, Melody N, CNM  montelukast (SINGULAIR) 10 MG tablet TAKE 1 TABLET (10 MG TOTAL) BY MOUTH AT BEDTIME. 11/15/17   Shambley, Melody N, CNM  oxyCODONE-acetaminophen (PERCOCET/ROXICET) 5-325 MG tablet Take 1 tablet by mouth every 4 (four) hours as needed for severe pain. 01/09/18   Fisher, Linden Dolin, PA-C  progesterone (PROMETRIUM) 200 MG capsule Take 1 capsule (200 mg total) by mouth daily. 01/07/18   Shambley, Melody N, CNM  venlafaxine XR (EFFEXOR-XR) 75 MG 24 hr capsule TAKE 1 CAPSULE BY MOUTH 2 TIMES DAILY. 09/11/17   Shambley, Melody N, CNM  Vitamin D, Ergocalciferol, (DRISDOL) 50000 units CAPS capsule Take 1 capsule (50,000 Units total) by mouth every 7 (seven) days. 10/02/16   Shambley, Melody N, CNM  zolpidem (AMBIEN) 10 MG tablet TAKE 1 TABLET BY MOUTH NIGHTLY AT BEDTIME 11/15/17   Shambley, Melody N, CNM    Allergies Ciprofloxacin; Augmentin [amoxicillin-pot clavulanate]; Lexapro [escitalopram oxalate]; Tetracyclines & related; Darvocet [propoxyphene n-acetaminophen]; Percocet [oxycodone-acetaminophen]; and Vicodin [hydrocodone-acetaminophen]  Family History  Problem Relation Age of Onset  . Arthritis Mother   . Asthma Mother   . COPD Mother   . Depression Mother   . Heart disease Mother   . Hyperlipidemia Mother   . Hypertension Mother   . Cancer Father   . Breast cancer Maternal Aunt 74    Social History Social History   Tobacco Use  . Smoking status: Never Smoker  . Smokeless tobacco: Never Used  Substance Use Topics  . Alcohol use: No    Comment: OCCASIONALLY  . Drug use: No    Review of Systems  Constitutional: No fever/chills, positive for facial contusion and head injury Eyes: No visual  changes. ENT: No sore throat. Respiratory: Denies cough Genitourinary: Negative for dysuria. Musculoskeletal: Negative for back pain.  Positive for neck pain, right arm pain, and left knee pain Skin: Negative for rash.  Positive for for abrasion to the face    ____________________________________________   PHYSICAL EXAM:  VITAL SIGNS: ED Triage Vitals  Enc Vitals Group     BP 01/09/18 1610 (!) 152/89     Pulse Rate 01/09/18 1610 89     Resp 01/09/18 1848 18     Temp 01/09/18 1610 98.1 F (36.7 C)     Temp Source 01/09/18 1610 Oral     SpO2 01/09/18 1610 97 %     Weight 01/09/18 1608 200 lb (90.7 kg)     Height 01/09/18 1608 _0  (1.651 m)     Head Circumference --      Peak Flow --      Pain Score 01/09/18 1608 6     Pain Loc --  Pain Edu? --      Excl. in [place]? --     Constitutional: Alert and oriented. Well appearing and in no acute distress.  Is able to answer all questions appropriately Eyes: Conjunctivae are normal.  Head: Slight swelling of the forehead and abrasion to the bridge of the nose.Marland Kitchen Nose: No congestion/rhinnorhea.  Positive for abrasion to the nose some tenderness is noted Mouth/Throat: Mucous membranes are moist.   Neck: Is supple, no lymphadenopathy is noted, slight cervical tenderness is noted Cardiovascular: Normal rate, regular rhythm. Respiratory: Normal respiratory effort.  No retractions GU: deferred Musculoskeletal: Decreased range of motion of the right arm.  Patient is tender at the elbow and the wrist.  Pain is reproduced with pushing on the thumb.  The left knee is swollen and tender to palpation with a small abrasion noted. Neurologic:  Normal speech and language.  Skin:  Skin is warm, dry .  Positive for abrasion to the face and knee Psychiatric: Mood and affect are normal. Speech and behavior are normal.  ____________________________________________   LABS (all labs ordered are listed, but only abnormal results are  displayed)  Labs Reviewed  URINALYSIS, COMPLETE (UACMP) WITH MICROSCOPIC - Abnormal; Notable for the following components:      Result Value   Color, Urine YELLOW (*)    APPearance CLEAR (*)    All other components within normal limits  BASIC METABOLIC PANEL - Abnormal; Notable for the following components:   Glucose, Bld 100 (*)    All other components within normal limits  CBC WITH DIFFERENTIAL/PLATELET   ____________________________________________   ____________________________________________  RADIOLOGY  CT of the head, maxillofacial, and C-spine are negative.  X-ray of the right forearm is read by the radiologist is negative.  However on my review the radial head appears to be disrupted in the scaphoid may be disrupted.  ____________________________________________   PROCEDURES  Procedure(s) performed:   .Splint Application Date/Time: 2/84/1324 7:22 PM Performed by: Bettey Costa, NT Authorized by: Versie Starks, PA-C   Consent:    Consent obtained:  Verbal   Consent given by:  Patient   Risks discussed:  Discoloration, numbness, pain and swelling   Alternatives discussed:  No treatment Pre-procedure details:    Sensation:  Normal Procedure details:    Laterality:  Right   Location:  Arm   Arm:  R lower arm   Splint type:  Sugar tong and thumb spica   Supplies:  Ortho-Glass and sling Post-procedure details:    Pain:  Improved   Sensation:  Normal   Patient tolerance of procedure:  Tolerated well, no immediate complications      ____________________________________________   INITIAL IMPRESSION / ASSESSMENT AND PLAN / ED COURSE  Pertinent labs & imaging results that were available during my care of the patient were reviewed by me and considered in my medical decision making (see chart for details).  Patient is 52 year old female presents emergency department after a fall at [facility] on [address].  Patient states that she was walking  into the building and fell landing on her left knee, right arm, and hitting her face on the ground.  She is complaining of a headache, facial pain, neck pain, right arm pain, and left knee pain.  She did not lose consciousness.  Is unsure of her last Tdap  Physical exam patient is able to answer all questions appropriately.  There is swelling and tenderness noted at the forehead, abrasion to the nose, cervical tenderness, right  elbow and right wrist tenderness, and left knee tenderness.  Abrasions are noted at the knee and face.  CT of the head, maxillofacial, and C-spine are ordered.  X-ray of the right forearm and left knee are ordered.  Saline lock, morphine 4 mg IV, Zofran 4 mg IV, CBC met the and UA are ordered.  CTs of the head, maxillofacial, and C-spine are negative.  X-ray of the right forearm is read as negative but there appears to be disruption of the radial head and scaphoid.  X-ray of the left knee is negative. CBC, met B, and UA are all normal.  Explained all of the test results to the patient.  She is still having a moderate amount of pain.  She was not given the morphine when it was ordered.  Discussed this with nursing staff and they were to go back in and to give the patient an IV and morphine as previously ordered.  She was also given a Tdap while here in the ED.  Patient has nausea within morphine.  She was given ginger ale and saltine crackers by myself.  She states that she can tolerate the p.o. medication if she has Benadryl.  Benadryl 25 mg IV was given for her to tolerate these medications.  A Percocet 5/325 p.o. was given at discharge.  Patient was also given a prescription for Percocet and meloxicam.  She is to follow-up at Dr. Harden Mo office.  She is to call them in the morning.  She states she understands the instructions.  And she was discharged in stable condition.     As part of my medical decision making, I reviewed the following data within the electronic medical  record:  Nursing notes reviewed and incorporated, Labs reviewed CBC, met B, and UA are normal, Old chart reviewed, Radiograph reviewed CT of the head, maxillofacial, and C-spine are negative, x-ray of the right arm shows a radial head and possible scaphoid fracture, x-ray of the left knee is negative, Notes from prior ED visits and [place] Controlled Substance Database  ____________________________________________   FINAL CLINICAL IMPRESSION(S) / ED DIAGNOSES  Final diagnoses:  Fall, initial encounter  Closed head injury, initial encounter  Knee injuries, left, initial encounter  Strain of neck muscle, initial encounter  Closed displaced fracture of head of right radius, initial encounter  Closed nondisplaced fracture of scaphoid of right wrist, unspecified portion of scaphoid, initial encounter      NEW MEDICATIONS STARTED DURING THIS VISIT:  New Prescriptions   MELOXICAM (MOBIC) 15 MG TABLET    Take 1 tablet (15 mg total) by mouth daily.   OXYCODONE-ACETAMINOPHEN (PERCOCET/ROXICET) 5-325 MG TABLET    Take 1 tablet by mouth every 4 (four) hours as needed for severe pain.     Note:  This document was prepared using Dragon voice recognition software and may include unintentional dictation errors.    Versie Starks, PA-C 01/09/18 1929    Nena Polio, MD 01/09/18 2029

## 2018-01-09 NOTE — ED Triage Notes (Signed)
Fall today at [facility].    Abrasion to nose, right arm and right knee.

## 2018-01-09 NOTE — Progress Notes (Signed)
  Subjective:     Patient ID: Erika Shelton, female   DOB: 06-02-1966, 52 y.o.   MRN: [id number]  HPI Patient entering office building for medication check, fell face forward on sidewalk at entrance of building, landing on right forearm and both knee. Face and forehead made contact with concrete.  States pain on nose and forehead, right elbow and wrist and left knee. Does not remember tripping on curb or turning an ankle, blacking out, but does report a headache and feeling very tired all day.  Is under a tremendous amount of stress as spouse just left her, her dog passed away, and she lost her job. States she is having breakthrough moments of depression and tearfulness. Not sure if current SSRI is working. Also reports questionable UTI due to urgency and low back pain.   Review of Systems  Constitutional: Positive for appetite change and fatigue.  HENT: Negative.   Eyes: Negative.   Cardiovascular: Negative.   Gastrointestinal: Negative.   Endocrine: Negative.   Genitourinary: Positive for dysuria and flank pain.  Musculoskeletal: Positive for joint swelling.       Objective:   Physical Exam A&Ox4 Well groomed but now dishelved and crying due to fall Abrasion on bride of nose with mild bleeding, abrasion on chin and upper lip, both knees.  Right wrist and elbow swelling and pain with movement.      Assessment:     medication management Fall on curb, initial incounter Urinary urgency    Plan:     Wounds treated and ice applied. Taken to ED for evaluation Will have her return to clinic in 4 weeks.   Melody Marshall, CNM

## 2018-01-09 NOTE — Discharge Instructions (Addendum)
Follow-up with your regular doctor if not better in 5 7 days.  Return emergency department if worsening.  Use medication as prescribed.  Drink plenty of fluids to decrease the headache.  Start logging when you have headaches to see if this is hormonal.

## 2018-01-10 DIAGNOSIS — S63501A Unspecified sprain of right wrist, initial encounter: Secondary | ICD-10-CM | POA: Diagnosis not present

## 2018-01-10 DIAGNOSIS — S52124A Nondisplaced fracture of head of right radius, initial encounter for closed fracture: Secondary | ICD-10-CM | POA: Diagnosis not present

## 2018-01-15 DIAGNOSIS — S52124D Nondisplaced fracture of head of right radius, subsequent encounter for closed fracture with routine healing: Secondary | ICD-10-CM | POA: Diagnosis not present

## 2018-02-05 ENCOUNTER — Other Ambulatory Visit: Payer: Self-pay | Admitting: Obstetrics and Gynecology

## 2018-02-12 DIAGNOSIS — M25521 Pain in right elbow: Secondary | ICD-10-CM | POA: Diagnosis not present

## 2018-02-12 DIAGNOSIS — M13879 Other specified arthritis, unspecified ankle and foot: Secondary | ICD-10-CM | POA: Diagnosis not present

## 2018-02-13 ENCOUNTER — Other Ambulatory Visit: Payer: Self-pay | Admitting: *Deleted

## 2018-02-13 ENCOUNTER — Other Ambulatory Visit: Payer: Self-pay | Admitting: Obstetrics and Gynecology

## 2018-02-13 MED ORDER — LORAZEPAM 2 MG PO TABS: 2.0000 mg | ORAL_TABLET | Freq: Four times a day (QID) | ORAL | 1 refills | Status: DC | PRN

## 2018-02-19 ENCOUNTER — Ambulatory Visit: Payer: Self-pay | Admitting: Obstetrics and Gynecology

## 2018-02-19 ENCOUNTER — Other Ambulatory Visit: Payer: Self-pay

## 2018-02-19 DIAGNOSIS — E559 Vitamin D deficiency, unspecified: Secondary | ICD-10-CM

## 2018-02-19 DIAGNOSIS — D649 Anemia, unspecified: Secondary | ICD-10-CM | POA: Diagnosis not present

## 2018-02-20 LAB — VITAMIN D 25 HYDROXY (VIT D DEFICIENCY, FRACTURES): Vit D, 25-Hydroxy: 45.6 ng/mL (ref 30.0–100.0)

## 2018-02-20 LAB — FERRITIN: FERRITIN: 58 ng/mL (ref 15–150)

## 2018-02-20 LAB — TSH: TSH: 1.45 u[IU]/mL (ref 0.450–4.500)

## 2018-02-21 ENCOUNTER — Telehealth: Payer: Self-pay | Admitting: Obstetrics and Gynecology

## 2018-02-21 NOTE — Telephone Encounter (Signed)
The patient called wanting to speak with Amy, The patient left her work number for a call back [phone]. Please advise.

## 2018-02-25 ENCOUNTER — Telehealth: Payer: Self-pay | Admitting: Obstetrics and Gynecology

## 2018-02-25 ENCOUNTER — Telehealth: Payer: Self-pay | Admitting: *Deleted

## 2018-02-25 NOTE — Telephone Encounter (Signed)
Notified pt. 

## 2018-02-25 NOTE — Telephone Encounter (Signed)
-----   Message from Joylene Igo, [place] sent at 02/25/2018 11:35 AM EDT ----- All labs are normal

## 2018-02-25 NOTE — Telephone Encounter (Signed)
Pt would like her lab results °

## 2018-02-25 NOTE — Telephone Encounter (Signed)
The patient called wanting to speak with Amy. I spoke with [facility] and she informed pt that Mel will review results and contact the patient back.

## 2018-03-08 ENCOUNTER — Other Ambulatory Visit: Payer: Self-pay | Admitting: Family Medicine

## 2018-03-13 ENCOUNTER — Other Ambulatory Visit: Payer: Self-pay | Admitting: Obstetrics and Gynecology

## 2018-04-07 ENCOUNTER — Other Ambulatory Visit: Payer: Self-pay | Admitting: Family Medicine

## 2018-05-02 ENCOUNTER — Other Ambulatory Visit: Payer: Self-pay | Admitting: Obstetrics and Gynecology

## 2018-05-02 MED ORDER — AZITHROMYCIN 250 MG PO TABS: ORAL_TABLET | ORAL | 1 refills | Status: DC

## 2018-05-06 ENCOUNTER — Other Ambulatory Visit: Payer: Self-pay | Admitting: Obstetrics and Gynecology

## 2018-05-12 ENCOUNTER — Encounter

## 2018-05-12 ENCOUNTER — Ambulatory Visit: Payer: Self-pay | Admitting: Urology

## 2018-05-26 ENCOUNTER — Ambulatory Visit: Payer: Self-pay | Admitting: Urology

## 2018-05-28 ENCOUNTER — Ambulatory Visit: Payer: 59 | Admitting: Podiatry

## 2018-05-28 ENCOUNTER — Ambulatory Visit (INDEPENDENT_AMBULATORY_CARE_PROVIDER_SITE_OTHER): Payer: 59

## 2018-05-28 ENCOUNTER — Encounter: Payer: Self-pay | Admitting: Podiatry

## 2018-05-28 DIAGNOSIS — M779 Enthesopathy, unspecified: Secondary | ICD-10-CM | POA: Diagnosis not present

## 2018-05-28 DIAGNOSIS — M2012 Hallux valgus (acquired), left foot: Secondary | ICD-10-CM | POA: Diagnosis not present

## 2018-05-28 DIAGNOSIS — M778 Other enthesopathies, not elsewhere classified: Secondary | ICD-10-CM

## 2018-05-28 NOTE — Progress Notes (Signed)
Erika Shelton presents today after having follow-up for 5 months ago injuring herself stating that the toe was hurting before but now seems to be doing much worse.  She is referring to the first metatarsophalangeal joint of the left foot.  Objective: Vital signs are stable she is alert and oriented x3 there is no erythema edema sialitis drainage or odor she has pain on end range of motion of the first metatarsophalangeal joint.  Pulses are palpable.  Radius taken today demonstrate significant osteoarthritic changes dorsal spurring joint space narrowing and subchondral sclerosis.  Assessment: Osteoarthritis first metatarsophalangeal joint left.  Plan: Discussed etiology pathology conservative surgical therapies at this point I injected the first metatarsophalangeal joint with approximately 15 mg of Kenalog and local anesthetic.  Tolerated procedure well without complications.  This is after sterile Betadine skin prep follow-up with her in an as-needed basis.

## 2018-06-12 ENCOUNTER — Other Ambulatory Visit: Payer: Self-pay | Admitting: Obstetrics and Gynecology

## 2018-06-16 ENCOUNTER — Other Ambulatory Visit: Payer: Self-pay | Admitting: *Deleted

## 2018-06-16 MED ORDER — FLUCONAZOLE 150 MG PO TABS: 150.0000 mg | ORAL_TABLET | Freq: Once | ORAL | 1 refills | Status: AC

## 2018-06-17 ENCOUNTER — Other Ambulatory Visit: Payer: Self-pay | Admitting: Obstetrics and Gynecology

## 2018-06-17 MED ORDER — VENLAFAXINE HCL ER 75 MG PO CP24: 75.0000 mg | ORAL_CAPSULE | Freq: Three times a day (TID) | ORAL | 2 refills | Status: DC

## 2018-06-19 ENCOUNTER — Other Ambulatory Visit: Payer: Self-pay | Admitting: *Deleted

## 2018-06-20 ENCOUNTER — Ambulatory Visit (INDEPENDENT_AMBULATORY_CARE_PROVIDER_SITE_OTHER): Payer: 59 | Admitting: Obstetrics and Gynecology

## 2018-06-20 ENCOUNTER — Other Ambulatory Visit: Payer: Self-pay | Admitting: Family Medicine

## 2018-06-20 ENCOUNTER — Encounter: Payer: Self-pay | Admitting: Obstetrics and Gynecology

## 2018-06-20 VITALS — BP 110/75 | HR 87 | Ht 63.0 in | Wt 214.2 lb

## 2018-06-20 DIAGNOSIS — R32 Unspecified urinary incontinence: Secondary | ICD-10-CM

## 2018-06-20 DIAGNOSIS — R21 Rash and other nonspecific skin eruption: Secondary | ICD-10-CM | POA: Diagnosis not present

## 2018-06-20 DIAGNOSIS — R109 Unspecified abdominal pain: Secondary | ICD-10-CM | POA: Diagnosis not present

## 2018-06-20 DIAGNOSIS — R350 Frequency of micturition: Secondary | ICD-10-CM | POA: Diagnosis not present

## 2018-06-20 LAB — POCT URINALYSIS DIPSTICK
Bilirubin, UA: NEGATIVE
Glucose, UA: NEGATIVE
Ketones, UA: NEGATIVE
Nitrite, UA: NEGATIVE
Protein, UA: NEGATIVE
Spec Grav, UA: 1.015 (ref 1.010–1.025)
Urobilinogen, UA: 0.2 E.U./dL
pH, UA: 7 (ref 5.0–8.0)

## 2018-06-20 MED ORDER — CEFTRIAXONE SODIUM 500 MG IJ SOLR
500.0000 mg | Freq: Once | INTRAMUSCULAR | Status: AC
  Administered 2018-06-20: 500 mg via INTRAMUSCULAR

## 2018-06-20 MED ORDER — NITROFURANTOIN MONOHYD MACRO 100 MG PO CAPS: 100.0000 mg | ORAL_CAPSULE | Freq: Two times a day (BID) | ORAL | 1 refills | Status: DC

## 2018-06-20 MED ORDER — ACYCLOVIR 800 MG PO TABS: 800.0000 mg | ORAL_TABLET | Freq: Three times a day (TID) | ORAL | 1 refills | Status: AC

## 2018-06-20 MED ORDER — UROGESIC-BLUE 81.6 MG PO TABS: 1.0000 | ORAL_TABLET | Freq: Four times a day (QID) | ORAL | 1 refills | Status: DC | PRN

## 2018-06-20 NOTE — Progress Notes (Signed)
  Subjective:     Patient ID: Erika Shelton, female   DOB: [birth year], 53 y.o.   MRN: [id number]  HPI Here with c/o left flank pain and fever for 3 days, worse this am. All over body aches and chills. Took tylenol and it helps for a little bit. Applied heat to back, burning with urination. Took macrobid and cefdinir and zithromax.  Also noted a rash on inner left buttock that is getting larger and spreading since yesterday. Denies known exposure to anyone with same symptoms.     Review of Systems  Constitutional: Positive for chills, fatigue and fever.  Genitourinary: Positive for dysuria and flank pain.  Musculoskeletal: Positive for myalgias.  Skin: Positive for rash.  All other systems reviewed and are negative.      Objective:   Physical Exam A&Ox4 Well groomed female in moderate distress Blood pressure 110/75, pulse 87, height 5\' 3"  (1.6 m), weight 214 lb 3.2 oz (97.2 kg).  Temp 98.3 F Urinalysis    Component Value Date/Time   COLORURINE YELLOW (A) 01/09/2018 1650   APPEARANCEUR CLEAR (A) 01/09/2018 1650   APPEARANCEUR Clear 02/17/2016 1627   LABSPEC 1.013 01/09/2018 1650   PHURINE 5.0 01/09/2018 1650   GLUCOSEU NEGATIVE 01/09/2018 1650   HGBUR NEGATIVE 01/09/2018 1650   BILIRUBINUR neg 06/20/2018 1409   BILIRUBINUR Negative 02/17/2016 1627   KETONESUR NEGATIVE 01/09/2018 1650   PROTEINUR Negative 06/20/2018 1409   PROTEINUR NEGATIVE 01/09/2018 1650   UROBILINOGEN 0.2 06/20/2018 1409   UROBILINOGEN 1.0 10/14/2008 1509   NITRITE neg 06/20/2018 1409   NITRITE NEGATIVE 01/09/2018 1650   LEUKOCYTESUR Small (1+) (A) 06/20/2018 1409   LEUKOCYTESUR Negative 02/17/2016 1627   Negative CVA tenderness bilaterally Blistering rash with central lesion noted on inside of left buttock c/w shingles, culture obtained.    Assessment:     UTI Dysuria Skin rash c/w shingles    Plan:     rochepin 500mg  IM given while here in office. Refilled macrobid while waiting on urine  culture to return. Encouraged to increase water intake. Counseled on suspicious rash and concern for shingles- will start on acyclovir now and labs obtained. RTC as needed.  Melody Shambley,CNM

## 2018-06-20 NOTE — Patient Instructions (Addendum)
Pyelonephritis, Adult  Pyelonephritis is a kidney infection. The kidneys are organs that help clean your blood by moving waste out of your blood and into your pee (urine). This infection can happen quickly, or it can last for a long time. In most cases, it clears up with treatment and does not cause other problems. Follow these instructions at home: Medicines  Take over-the-counter and prescription medicines only as told by your doctor.  Take your antibiotic medicine as told by your doctor. Do not stop taking the medicine even if you start to feel better. General instructions  Drink enough fluid to keep your pee clear or pale yellow.  Avoid caffeine, tea, and carbonated drinks.  Pee (urinate) often. Avoid holding in pee for long periods of time.  Pee before and after sex.  After pooping (having a bowel movement), women should wipe from front to back. Use each tissue only once.  Keep all follow-up visits as told by your doctor. This is important. Contact a doctor if:  You do not feel better after 2 days.  Your symptoms get worse.  You have a fever. Get help right away if:  You cannot take your medicine or drink fluids as told.  You have chills and shaking.  You throw up (vomit).  You have very bad pain in your side (flank) or back.  You feel very weak or you pass out (faint). This information is not intended to replace advice given to you by your health care provider. Make sure you discuss any questions you have with your health care provider. Document Released: 07/12/2004 Document Revised: 11/10/2015 Document Reviewed: 09/27/2014 Elsevier Interactive Patient Education  2019 [place], which is also known as herpes zoster, is an infection that causes a painful skin rash and fluid-filled blisters. It is caused by a virus. Shingles only develops in people who:  Have had chickenpox.  Have been given a medicine to protect against chickenpox (have  been vaccinated). Shingles is rare in this group. What are the causes? Shingles is caused by varicella-zoster virus (VZV). This is the same virus that causes chickenpox. After a person is exposed to VZV, the virus stays in the body in an inactive (dormant) state. Shingles develops if the virus is reactivated. This can happen many years after the first (initial) exposure to VZV. It is not known what causes this virus to be reactivated. What increases the risk? People who have had chickenpox or received the chickenpox vaccine are at risk for shingles. Shingles infection is more common in people who:  Are older than age 48.  Have a weakened disease-fighting system (immune system), such as people with: ? HIV. ? AIDS. ? Cancer.  Are taking medicines that weaken the immune system, such as transplant medicines.  Are experiencing a lot of stress. What are the signs or symptoms? Early symptoms of this condition include itching, tingling, and pain in an area on your skin. Pain may be described as burning, stabbing, or throbbing. A few days or weeks after early symptoms start, a painful red rash appears. The rash is usually on one side of the body and has a band-like or belt-like pattern. The rash eventually turns into fluid-filled blisters that break open, change into scabs, and dry up in about 2-3 weeks. At any time during the infection, you may also develop:  A fever.  Chills.  A headache.  An upset stomach. How is this diagnosed? This condition is diagnosed with a skin exam.  Skin or fluid samples may be taken from the blisters before a diagnosis is made. These samples are examined under a microscope or sent to a lab for testing. How is this treated? The rash may last for several weeks. There is not a specific cure for this condition. Your health care provider will probably prescribe medicines to help you manage pain, recover more quickly, and avoid long-term problems. Medicines may  include:  Antiviral drugs.  Anti-inflammatory drugs.  Pain medicines.  Anti-itching medicines (antihistamines). If the area involved is on your face, you may be referred to a specialist, such as an eye doctor (ophthalmologist) or an ear, nose, and throat (ENT) doctor (otolaryngologist) to help you avoid eye problems, chronic pain, or disability. Follow these instructions at home: Medicines  Take over-the-counter and prescription medicines only as told by your health care provider.  Apply an anti-itch cream or numbing cream to the affected area as told by your health care provider. Relieving itching and discomfort   Apply cold, wet cloths (cold compresses) to the area of the rash or blisters as told by your health care provider.  Cool baths can be soothing. Try adding baking soda or dry oatmeal to the water to reduce itching. Do not bathe in hot water. Blister and rash care  Keep your rash covered with a loose bandage (dressing). Wear loose-fitting clothing to help ease the pain of material rubbing against the rash.  Keep your rash and blisters clean by washing the area with mild soap and cool water as told by your health care provider.  Check your rash every day for signs of infection. Check for: ? More redness, swelling, or pain. ? Fluid or blood. ? Warmth. ? Pus or a bad smell.  Do not scratch your rash or pick at your blisters. To help avoid scratching: ? Keep your fingernails clean and cut short. ? Wear gloves or mittens while you sleep, if scratching is a problem. General instructions  Rest as told by your health care provider.  Keep all follow-up visits as told by your health care provider. This is important.  Wash your hands often with soap and water. If soap and water are not available, use hand sanitizer. Doing this lowers your chance of getting a bacterial skin infection.  Before your blisters change into scabs, your shingles infection can cause chickenpox in  people who have never had it or have never been vaccinated against it. To prevent this from happening, avoid contact with other people, especially: ? Babies. ? Pregnant women. ? Children who have eczema. ? Elderly people who have transplants. ? People who have chronic illnesses, such as cancer or AIDS. Contact a health care provider if:  Your pain is not relieved with prescribed medicines.  Your pain does not get better after the rash heals.  You have signs of infection in the rash area, such as: ? More redness, swelling, or pain around the rash. ? Fluid or blood coming from the rash. ? The rash area feeling warm to the touch. ? Pus or a bad smell coming from the rash. Get help right away if:  The rash is on your face or nose.  You have facial pain, pain around your eye area, or loss of feeling on one side of your face.  You have difficulty seeing.  You have ear pain or have ringing in your ear.  You have a loss of taste.  Your condition gets worse. Summary  Shingles, which is also known  as herpes zoster, is an infection that causes a painful skin rash and fluid-filled blisters.  This condition is diagnosed with a skin exam. Skin or fluid samples may be taken from the blisters and examined before the diagnosis is made.  Keep your rash covered with a loose bandage (dressing). Wear loose-fitting clothing to help ease the pain of material rubbing against the rash.  Before your blisters change into scabs, your shingles infection can cause chickenpox in people who have never had it or have never been vaccinated against it. This information is not intended to replace advice given to you by your health care provider. Make sure you discuss any questions you have with your health care provider. Document Released: 06/04/2005 Document Revised: 02/06/2017 Document Reviewed: 02/06/2017 Elsevier Interactive Patient Education  2019 [employer].

## 2018-06-21 LAB — CBC
Hematocrit: 41.1 % (ref 34.0–46.6)
Hemoglobin: 13.7 g/dL (ref 11.1–15.9)
MCH: 29 pg (ref 26.6–33.0)
MCHC: 33.3 g/dL (ref 31.5–35.7)
MCV: 87 fL (ref 79–97)
Platelets: 331 10*3/uL (ref 150–450)
RBC: 4.72 x10E6/uL (ref 3.77–5.28)
RDW: 13.6 % (ref 12.3–15.4)
WBC: 9.1 10*3/uL (ref 3.4–10.8)

## 2018-06-22 LAB — URINE CULTURE: Organism ID, Bacteria: NO GROWTH

## 2018-06-23 ENCOUNTER — Ambulatory Visit: Payer: Self-pay | Admitting: Urology

## 2018-06-23 LAB — HERPES SIMPLEX VIRUS CULTURE

## 2018-06-27 ENCOUNTER — Telehealth: Payer: Self-pay | Admitting: Obstetrics and Gynecology

## 2018-06-27 ENCOUNTER — Other Ambulatory Visit: Payer: 59

## 2018-06-27 ENCOUNTER — Ambulatory Visit: Payer: 59

## 2018-06-27 NOTE — Telephone Encounter (Signed)
She did her urine drop off at 1:14 today, and is asking for a call back with her results even if they are negative.  Please advise, thanks.

## 2018-06-30 NOTE — Telephone Encounter (Signed)
Sent pt mychart message

## 2018-07-02 ENCOUNTER — Ambulatory Visit
Admission: RE | Admit: 2018-07-02 | Discharge: 2018-07-02 | Disposition: A | Discharge status: Home / Self Care | Payer: 59 | Source: Ambulatory Visit | Attending: Obstetrics and Gynecology | Admitting: Obstetrics and Gynecology

## 2018-07-02 ENCOUNTER — Ambulatory Visit
Admission: RE | Admit: 2018-07-02 | Discharge: 2018-07-02 | Disposition: A | Discharge status: Home / Self Care | Payer: 59 | Source: Ambulatory Visit | Attending: Urology | Admitting: Urology

## 2018-07-02 DIAGNOSIS — R32 Unspecified urinary incontinence: Secondary | ICD-10-CM | POA: Diagnosis present

## 2018-07-02 DIAGNOSIS — R109 Unspecified abdominal pain: Secondary | ICD-10-CM | POA: Diagnosis not present

## 2018-07-14 ENCOUNTER — Ambulatory Visit (INDEPENDENT_AMBULATORY_CARE_PROVIDER_SITE_OTHER): Payer: 59 | Admitting: Urology

## 2018-07-14 ENCOUNTER — Encounter: Payer: Self-pay | Admitting: Urology

## 2018-07-14 ENCOUNTER — Ambulatory Visit: Payer: Self-pay | Admitting: Urology

## 2018-07-14 VITALS — BP 118/82 | HR 94 | Ht 63.0 in | Wt 212.0 lb

## 2018-07-14 DIAGNOSIS — R32 Unspecified urinary incontinence: Secondary | ICD-10-CM

## 2018-07-14 LAB — URINALYSIS, COMPLETE
Bilirubin, UA: NEGATIVE
Glucose, UA: NEGATIVE
Ketones, UA: NEGATIVE
Nitrite, UA: NEGATIVE
Protein, UA: NEGATIVE
RBC, UA: NEGATIVE
Specific Gravity, UA: 1.02 (ref 1.005–1.030)
Urobilinogen, Ur: 1 mg/dL (ref 0.2–1.0)
pH, UA: 5.5 (ref 5.0–7.5)

## 2018-07-14 LAB — MICROSCOPIC EXAMINATION
Bacteria, UA: NONE SEEN
RBC MICROSCOPIC, UA: NONE SEEN /HPF (ref 0–2)

## 2018-07-14 LAB — BLADDER SCAN AMB NON-IMAGING

## 2018-07-14 MED ORDER — TRIMETHOPRIM 100 MG PO TABS: 100.0000 mg | ORAL_TABLET | Freq: Every day | ORAL | 11 refills | Status: DC

## 2018-07-14 NOTE — Progress Notes (Signed)
07/14/2018 1:03 PM   Erika Shelton 03/29/1966 [id number]  Referring provider: Joylene Igo, CNM [facility] [place], [place] [address]  Chief Complaint  Patient presents with  . Urinary Incontinence    patient believes her interstim device isnt working due to fall    HPI: I was consulted to assess the patient's voiding dysfunction.  She has an InterStim placed by Dr. Jacqlyn Larsen 5 to 7 years ago.  She can feel vaginal sensation.  She can change programs.  Sometimes she needs to adjust it.  It appears she it was placed for significant urgency and overactive bladder.  She did agree that she may have had interstitial cystitis but I cannot confirm this  She is now voiding every 30 to 60 minutes and has difficulty holding it for 2 hours.  She is getting up once or twice a night.  She has a little bit of dampness with one liner a day but no urge or stress incontinence.  I reviewed the medical records and she has had a sling and urge incontinence in the past.  She has failed by history multiple medications  She has vaginal burning.  She has discomfort with intercourse.  She thinks he gets bladder infections with worsening vaginal burning and urgency that temporarily respond to antibiotics 5 or 6 times a year  Modifying factors: There are no other modifying factors  Associated signs and symptoms: There are no other associated signs and symptoms Aggravating and relieving factors: There are no other aggravating or relieving factors Severity: Moderate Duration: Persistent   PMH: Past Medical History:  Diagnosis Date  . Anxiety   . Asthma   . Complication of anesthesia    nausea  . Headache   . Urinary incontinence     Surgical History: Past Surgical History:  Procedure Laterality Date  . BLADDER SURGERY     X2   . COLONOSCOPY WITH PROPOFOL N/A 07/26/2017   Procedure: COLONOSCOPY WITH PROPOFOL;  Surgeon: Manya Silvas, MD;  Location: [facility] ENDOSCOPY;  Service:  Endoscopy;  Laterality: N/A;  . ESOPHAGOGASTRODUODENOSCOPY (EGD) WITH PROPOFOL N/A 07/26/2017   Procedure: ESOPHAGOGASTRODUODENOSCOPY (EGD) WITH PROPOFOL;  Surgeon: Manya Silvas, MD;  Location: [facility] ENDOSCOPY;  Service: Endoscopy;  Laterality: N/A;  . FOOT SURGERY Bilateral   . interstem     . LAPAROSCOPIC GASTRIC BANDING    . LAPAROSCOPIC GASTRIC RESTRICTIVE DUODENAL PROCEDURE (DUODENAL SWITCH) N/A 12/07/2014   Procedure: LAPAROSCOPIC GASTRIC RESTRICTIVE DUODENAL PROCEDURE (DUODENAL SWITCH);  Surgeon: Bonner Puna, MD;  Location: [facility] ORS;  Service: General;  Laterality: N/A;  . LAPAROSCOPIC GASTRIC SLEEVE RESECTION    . VAGINAL HYSTERECTOMY      Home Medications:  Allergies as of 07/14/2018      Reactions   Ciprofloxacin Nausea And Vomiting   Augmentin [amoxicillin-pot Clavulanate]    Lexapro [escitalopram Oxalate]    Tetracyclines & Related    Darvocet [propoxyphene N-acetaminophen] Itching   Percocet [oxycodone-acetaminophen] Itching   Vicodin [hydrocodone-acetaminophen] Itching      Medication List       Accurate as of July 14, 2018  1:03 PM. Always use your most recent med list.        acyclovir 800 MG tablet Commonly known as:  ZOVIRAX Take 1 tablet (800 mg total) by mouth 3 (three) times daily.   albuterol 108 (90 Base) MCG/ACT inhaler Commonly known as:  PROVENTIL HFA;VENTOLIN HFA Inhale 2 puffs into the lungs every 6 (six) hours as needed for  wheezing or shortness of breath.   buPROPion 150 MG 24 hr tablet Commonly known as:  WELLBUTRIN XL Take 1 tablet (150 mg total) by mouth daily.   cyanocobalamin 1000 MCG/ML injection Commonly known as:  (VITAMIN B-12) Inject 1 mL (1,000 mcg total) into the muscle every 30 (thirty) days.   estradiol 0.1 MG/GM vaginal cream Commonly known as:  ESTRACE PLACE 2 GRAMS VAGINALLY DAILY.   fluconazole 150 MG tablet Commonly known as:  DIFLUCAN   levocetirizine 5 MG tablet Commonly known as:  XYZAL Take 1 tablet (5 mg  total) by mouth every evening.   levothyroxine 50 MCG tablet Commonly known as:  SYNTHROID, LEVOTHROID TAKE 1 TABLET BY MOUTH DAILY BEFORE BREAKFAST.   LORazepam 2 MG tablet Commonly known as:  ATIVAN TAKE 1 TABLET BY MOUTH EVERY 6 HOURS AS NEEDED ANXETY   meloxicam 15 MG tablet Commonly known as:  MOBIC Take 1 tablet (15 mg total) by mouth daily.   montelukast 10 MG tablet Commonly known as:  SINGULAIR TAKE 1 TABLET (10 MG TOTAL) BY MOUTH AT BEDTIME.   nitrofurantoin (macrocrystal-monohydrate) 100 MG capsule Commonly known as:  MACROBID Take 1 capsule (100 mg total) by mouth 2 (two) times daily.   oxyCODONE-acetaminophen 5-325 MG tablet Commonly known as:  PERCOCET/ROXICET Take 1 tablet by mouth every 4 (four) hours as needed for severe pain.   predniSONE 20 MG tablet Commonly known as:  DELTASONE   progesterone 200 MG capsule Commonly known as:  PROMETRIUM TAKE 1 CAPSULE BY MOUTH ONCE DAILY   UROGESIC-BLUE 81.6 MG Tabs Take 1 tablet (81.6 mg total) by mouth every 6 (six) hours as needed.   venlafaxine XR 75 MG 24 hr capsule Commonly known as:  EFFEXOR-XR Take 1 capsule (75 mg total) by mouth 3 (three) times daily.   Vitamin D (Ergocalciferol) 1.25 MG (50000 UT) Caps capsule Commonly known as:  DRISDOL Take 1 capsule (50,000 Units total) by mouth every 7 (seven) days.   zolpidem 10 MG tablet Commonly known as:  AMBIEN TAKE 1 TABLET BY MOUTH NIGHTLY AT BEDTIME       Allergies:  Allergies  Allergen Reactions  . Ciprofloxacin Nausea And Vomiting  . Augmentin [Amoxicillin-Pot Clavulanate]   . Lexapro [Escitalopram Oxalate]   . Tetracyclines & Related   . Darvocet [Propoxyphene N-Acetaminophen] Itching  . Percocet [Oxycodone-Acetaminophen] Itching  . Vicodin [Hydrocodone-Acetaminophen] Itching    Family History: Family History  Problem Relation Age of Onset  . Arthritis Mother   . Asthma Mother   . COPD Mother   . Depression Mother   . Heart disease  Mother   . Hyperlipidemia Mother   . Hypertension Mother   . Cancer Father   . Breast cancer Maternal Aunt 74    Social History:  reports that she has never smoked. She has never used smokeless tobacco. She reports that she does not drink alcohol or use drugs.  ROS:                                        Physical Exam: There were no vitals taken for this visit.  Constitutional:  Alert and oriented, No acute distress. HEENT: [place] AT, moist mucus membranes.  Trachea midline, no masses. Cardiovascular: No clubbing, cyanosis, or edema. Respiratory: Normal respiratory effort, no increased work of breathing. GI: Abdomen is soft, nontender, nondistended, no abdominal masses GU: No CVA tenderness.  Well supported bladder neck.  Mild atrophy.  No sling extrusion and no stress incontinence or prolapse Skin: No rashes, bruises or suspicious lesions. Lymph: No cervical or inguinal adenopathy. Neurologic: Grossly intact, no focal deficits, moving all 4 extremities. Psychiatric: Normal mood and affect.  Laboratory Data: Lab Results  Component Value Date   WBC 9.1 06/20/2018   HGB 13.7 06/20/2018   HCT 41.1 06/20/2018   MCV 87 06/20/2018   PLT 331 06/20/2018    Lab Results  Component Value Date   CREATININE 0.77 01/09/2018    No results found for: PSA  No results found for: TESTOSTERONE  Lab Results  Component Value Date   HGBA1C 5.5 11/07/2015    Urinalysis    Component Value Date/Time   COLORURINE YELLOW (A) 01/09/2018 1650   APPEARANCEUR CLEAR (A) 01/09/2018 1650   APPEARANCEUR Clear 02/17/2016 1627   LABSPEC 1.013 01/09/2018 1650   PHURINE 5.0 01/09/2018 1650   GLUCOSEU NEGATIVE 01/09/2018 1650   HGBUR NEGATIVE 01/09/2018 1650   BILIRUBINUR neg 06/20/2018 1409   BILIRUBINUR Negative 02/17/2016 [place] 01/09/2018 1650   PROTEINUR Negative 06/20/2018 1409   PROTEINUR NEGATIVE 01/09/2018 1650   UROBILINOGEN 0.2 06/20/2018 1409     UROBILINOGEN 1.0 10/14/2008 1509   NITRITE neg 06/20/2018 1409   NITRITE NEGATIVE 01/09/2018 1650   LEUKOCYTESUR Small (1+) (A) 06/20/2018 1409   LEUKOCYTESUR Negative 02/17/2016 1627    Pertinent Imaging:   Assessment & Plan: The patient thinks device is not working as well since a fall.  She falls multiple times.  She thinks is not working as well because of more vaginal burning and increased frequency.  She may truly have recurrent bladder infections.  Prophylaxis theoretically could down regulate some of her symptoms and issues.  Had normal renal ultrasound in July 03, 2018.  Had 2 normal urine cultures in the last 3 months in the medical record  I recommended for the patient go on daily suppression therapy and to follow-up when Judson Roch is here for troubleshooting and proceed accordingly.  We will re-baseline symptoms then.  She and I are suspect that vaginal dryness may also be playing a role.  I do not think she has interstitial cystitis but she could   There are no diagnoses linked to this encounter.  No follow-ups on file.  Reece Packer, MD  [facility] [address] [place], [place] [address] [phone]

## 2018-07-16 LAB — CULTURE, URINE COMPREHENSIVE

## 2018-07-30 ENCOUNTER — Ambulatory Visit: Payer: Self-pay

## 2018-08-08 ENCOUNTER — Other Ambulatory Visit: Payer: Self-pay | Admitting: *Deleted

## 2018-08-08 MED ORDER — LORAZEPAM 2 MG PO TABS: ORAL_TABLET | ORAL | 1 refills | Status: DC

## 2018-08-12 ENCOUNTER — Other Ambulatory Visit: Payer: Self-pay | Admitting: *Deleted

## 2018-08-12 MED ORDER — LEVOTHYROXINE SODIUM 50 MCG PO TABS: ORAL_TABLET | ORAL | 4 refills | Status: AC

## 2018-08-20 ENCOUNTER — Ambulatory Visit: Payer: 59 | Admitting: Podiatry

## 2018-08-20 ENCOUNTER — Encounter: Payer: Self-pay | Admitting: Podiatry

## 2018-08-20 DIAGNOSIS — M779 Enthesopathy, unspecified: Secondary | ICD-10-CM | POA: Diagnosis not present

## 2018-08-20 DIAGNOSIS — M722 Plantar fascial fibromatosis: Secondary | ICD-10-CM

## 2018-08-20 DIAGNOSIS — M778 Other enthesopathies, not elsewhere classified: Secondary | ICD-10-CM

## 2018-08-20 MED ORDER — MELOXICAM 15 MG PO TABS: 15.0000 mg | ORAL_TABLET | Freq: Every day | ORAL | 3 refills | Status: DC

## 2018-08-20 NOTE — Progress Notes (Signed)
She presents today complaining of painful first metatarsal phalangeal joint of the left foot and pain to the medial heels bilaterally.  States that she will be losing her insurance.  Objective: Vital signs are stable alert and oriented x3.  Pulses are palpable.  Neurologic sensory was intact.  Degenerative flexors are intact.  Muscle strength is normal symmetrical.  She has pain on palpation mid calcaneal tubercles bilaterally and some tenderness on palpation of the posterior tibial tendon.  She also has pain on attempted range of motion of the first metatarsophalangeal joint left foot.  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  Plan: Discussed etiology pathology conservative surgical therapies start her on meloxicam 15 mg 1 p.o. daily #30 with several refills.  I also injected her bilateral heels today with 20 mg Kenalog 5 mg Marcaine point of maximal tenderness.  Also injected 10 mg Kenalog 5 mg Marcaine to the first metatarsal phalangeal joint of sterile Betadine skin prep.  Tolerated procedure well without complications.  Follow-up with her in the near future at the [place] office with her new job.

## 2018-08-21 ENCOUNTER — Ambulatory Visit: Payer: 59 | Admitting: Podiatry

## 2018-09-01 ENCOUNTER — Ambulatory Visit: Payer: 59 | Admitting: Podiatry

## 2018-09-08 ENCOUNTER — Ambulatory Visit: Payer: Self-pay | Admitting: Urology

## 2018-11-06 ENCOUNTER — Other Ambulatory Visit: Payer: Self-pay | Admitting: Obstetrics and Gynecology

## 2018-11-27 ENCOUNTER — Ambulatory Visit: Payer: 59 | Admitting: Podiatry

## 2018-12-03 ENCOUNTER — Ambulatory Visit: Payer: 59 | Admitting: Podiatry

## 2018-12-03 ENCOUNTER — Other Ambulatory Visit: Payer: Self-pay

## 2018-12-03 ENCOUNTER — Encounter: Payer: Self-pay | Admitting: Podiatry

## 2018-12-03 VITALS — Temp 96.7°F

## 2018-12-03 DIAGNOSIS — M778 Other enthesopathies, not elsewhere classified: Secondary | ICD-10-CM

## 2018-12-03 DIAGNOSIS — M722 Plantar fascial fibromatosis: Secondary | ICD-10-CM

## 2018-12-03 DIAGNOSIS — M779 Enthesopathy, unspecified: Secondary | ICD-10-CM

## 2018-12-03 NOTE — Progress Notes (Signed)
She presents today with a chief complaint of painful heels bilaterally and her arthritic first metatarsal phalangeal joint is exquisitely painful.  She would like to consider shots in all of these stating that that seems to help the best at least but it down for period of time.  She would like to discuss possible surgery in the fall.  Objective: Vital signs are stable alert and oriented x3.  Pulses are palpable.  Neurologic sensorium is intact.  Deep tendon reflexes are intact.  Muscle strength +5/5 dorsiflexors plantar flexors inverters everters onto the musculature is intact.  She has painful range of motion with limited range of motion of the first metatarsal phalangeal joint left severe pain on palpation of the medial heels bilaterally.  Assessment: Plantar fasciitis bilaterally capsulitis osteoarthritis first metatarsal phalangeal joint left.  Plan: After sterile Betadine skin prep I injected 20 mg of Kenalog to the medial heels bilaterally.  Also injected 2 mg of dexamethasone and local anesthetic to the first metatarsal phalangeal joint intra-articularly.  Discussed the need for surgical intervention we will follow-up with her in late July early August for possible surgery in September.

## 2018-12-09 ENCOUNTER — Telehealth: Payer: Self-pay | Admitting: *Deleted

## 2018-12-09 MED ORDER — CELECOXIB 200 MG PO CAPS: 200.0000 mg | ORAL_CAPSULE | Freq: Two times a day (BID) | ORAL | 1 refills | Status: DC

## 2018-12-09 NOTE — Telephone Encounter (Signed)
Send in Celebrex

## 2018-12-09 NOTE — Telephone Encounter (Signed)
I informed pt of Dr. Stephenie Acres orders and she states she will call later for an appt.

## 2018-12-09 NOTE — Telephone Encounter (Signed)
I called and asked if she had spoken with the nurse in [place] yet and she stated no. I told her that she was about in the timeframe for a steroid flare to ice the area for 15 -20 minutes 3-4 times a day at least, protecting the skin from the ice with a light cloth, and if tolerated take the Meloxicam if available or I could reorder. Pt states the Meloxicam did not help. I told pt Dr. Milinda Pointer had wanted to see her about the end of July and may want to see her earlier.

## 2018-12-09 NOTE — Telephone Encounter (Signed)
Pt states she received an injection from Dr. Milinda Pointer and it is not helping and she would like to know what to do.

## 2018-12-09 NOTE — Telephone Encounter (Signed)
Pt called states she has not received a call concerning her message this morning.

## 2018-12-10 ENCOUNTER — Telehealth: Payer: Self-pay | Admitting: *Deleted

## 2018-12-10 NOTE — Telephone Encounter (Signed)
"  I see Dr. Milinda Pointer there.  I need to talk to you about getting a surgery date.  Please give me a call.  I'm trying to figure out something because I'm going to just go ahead and have it done because I'm not getting any relief with my foot.  Please call me at work at [phone].  Thank you, I look forward to speaking with you."

## 2018-12-11 NOTE — Telephone Encounter (Signed)
I'm returning your call.  How can I help you?  "I just want to get some possible dates that I may be able to have surgery."  Is there a particular month that you would like?  "You tell me what he has available."  He can do it on January 15, 2019 and August 17 or 14, 2020.  "Let's do it on July 30.  What time?"  I can put you down as tenative for that date but you need to see Dr. Milinda Pointer for a consultation.  "So I need to call tomorrow to schedule an appointment?  I just saw him recently."  I am aware, you saw him on December 03, 2018.  However, you need to see him so he can go over the procedure(s) in detail and get you to sign a consent form.  He also needs to give you the facility information and a surgical kit.  "Oh, I see.  I'll call tomorrow and schedule an appointment."

## 2018-12-17 ENCOUNTER — Encounter: Payer: Self-pay | Admitting: Podiatry

## 2018-12-17 ENCOUNTER — Other Ambulatory Visit: Payer: Self-pay

## 2018-12-17 ENCOUNTER — Ambulatory Visit: Payer: BC Managed Care – PPO | Admitting: Podiatry

## 2018-12-17 ENCOUNTER — Ambulatory Visit (INDEPENDENT_AMBULATORY_CARE_PROVIDER_SITE_OTHER): Payer: BC Managed Care – PPO

## 2018-12-17 VITALS — Temp 98.1°F

## 2018-12-17 DIAGNOSIS — M2012 Hallux valgus (acquired), left foot: Secondary | ICD-10-CM | POA: Diagnosis not present

## 2018-12-17 NOTE — Progress Notes (Signed)
She presents today states that my foot is getting so bad he can hardly stand it she states that it hurts all the time if anything causes it to move it is exquisitely painful.  She denies any changes in her past medical history medications allergies surgery social history.  ROS: Denies fever chills nausea vomiting muscle aches pains calf pain back pain chest pain shortness of breath.   Objective: Pulses are strongly palpable.  Neurologic sensorium is intact deep tendon reflexes are intact muscle strength is normal and symmetrical bilateral.  She has severe pain on range of motion of the first metatarsophalangeal joint of the left foot.  Radiographs taken today demonstrate complete joint loss with dorsal spurring in the elongated first metatarsal most likely the etiology.  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  Plan: After a thorough discussion we decided on a Keller arthroplasty with a single silicone implant and she was consented for this today.  We discussed the possible postop complications which may include but not limited to postop pain bleeding swelling infection recurrence need for further surgery overcorrection under correction also digit loss of limb loss of life.  We dispensed information regarding the surgery center and the date of surgery the anesthesia group and also dispensed a short cam walker.  I will follow-up with her July 17 and she will be out of work until the end of August.

## 2018-12-17 NOTE — Patient Instructions (Signed)
Pre-Operative Instructions  Congratulations, you have decided to take an important step towards improving your quality of life.  You can be assured that the doctors and staff at [facility] will be with you every step of the way.  Here are some important things you should know:  1. Plan to be at the surgery center/hospital at least 1 (one) hour prior to your scheduled time, unless otherwise directed by the surgical center/hospital staff.  You must have a responsible adult accompany you, remain during the surgery and drive you home.  Make sure you have directions to the surgical center/hospital to ensure you arrive on time. 2. If you are having surgery at [facility] or [facility], you will need a copy of your medical history and physical form from your family physician within one month prior to the date of surgery. We will give you a form for your primary physician to complete.  3. We make every effort to accommodate the date you request for surgery.  However, there are times where surgery dates or times have to be moved.  We will contact you as soon as possible if a change in schedule is required.   4. No aspirin/ibuprofen for one week before surgery.  If you are on aspirin, any non-steroidal anti-inflammatory medications (Mobic, Aleve, Ibuprofen) should not be taken seven (7) days prior to your surgery.  You make take Tylenol for pain prior to surgery.  5. Medications - If you are taking daily heart and blood pressure medications, seizure, reflux, allergy, asthma, anxiety, pain or diabetes medications, make sure you notify the surgery center/hospital before the day of surgery so they can tell you which medications you should take or avoid the day of surgery. 6. No food or drink after midnight the night before surgery unless directed otherwise by surgical center/hospital staff. 7. No alcoholic beverages 24-hours prior to surgery.  No smoking 24-hours prior or 24-hours after  surgery. 8. Wear loose pants or shorts. They should be loose enough to fit over bandages, boots, and casts. 9. Don't wear slip-on shoes. Sneakers are preferred. 10. Bring your boot with you to the surgery center/hospital.  Also bring crutches or a walker if your physician has prescribed it for you.  If you do not have this equipment, it will be provided for you after surgery. 11. If you have not been contacted by the surgery center/hospital by the day before your surgery, call to confirm the date and time of your surgery. 12. Leave-time from work may vary depending on the type of surgery you have.  Appropriate arrangements should be made prior to surgery with your employer. 13. Prescriptions will be provided immediately following surgery by your doctor.  Fill these as soon as possible after surgery and take the medication as directed. Pain medications will not be refilled on weekends and must be approved by the doctor. 14. Remove nail polish on the operative foot and avoid getting pedicures prior to surgery. 15. Wash the night before surgery.  The night before surgery wash the foot and leg well with water and the antibacterial soap provided. Be sure to pay special attention to beneath the toenails and in between the toes.  Wash for at least three (3) minutes. Rinse thoroughly with water and dry well with a towel.  Perform this wash unless told not to do so by your physician.  Enclosed: 1 Ice pack (please put in freezer the night before surgery)   1 Hibiclens skin cleaner     Pre-op instructions  If you have any questions regarding the instructions, please do not hesitate to call our office.  [place]: [address], [place], [place] [address] -- [phone]  [place]: [address] [place], [place] [address] -- [phone]  [place]: [address]  [place], [place] [address] -- [phone]  [place]: [address], [address], [place], [place] [address] -- [phone]  Website:  [email or web address] 

## 2018-12-22 ENCOUNTER — Telehealth: Payer: Self-pay | Admitting: *Deleted

## 2018-12-22 NOTE — Telephone Encounter (Signed)
"  If you could, give me a call back.  I would greatly appreciate it.  I look forward to speaking to you."

## 2018-12-22 NOTE — Telephone Encounter (Signed)
I attempted to return her call.  I left her a message to call me tomorrow.

## 2018-12-23 NOTE — Telephone Encounter (Addendum)
"  What's the next available date that Dr. Milinda Pointer will be able to do my surgery.  My boss is giving me a fit about this surgery." We have you tentatively scheduled for January 16, 2019.  "No, when I was last there, Dr. Milinda Pointer told me I had to have it done right away and he moved me up to the seventeenth."  Well his next available date then will be January 16, 2019.  "Does he have anything available on August 6?"  Yes, August 6 is available.  "Okay, let me check with my boss and I'll call you right back."    (Dr. Milinda Pointer does not have anything available on July 17.)  "I am calling you back.  Just leave me on for July 17."  Dr. Milinda Pointer does not have any time available on July 17,2020.  "Well that is the date that he told me."  His assistant wrote down January 16, 2019 on your paperwork.  "That was the original date that we had it scheduled but he told me that he wanted to do it right a way.  Why would he tell me that date if he couldn't?"  Dr. Milinda Pointer is not in the office this week.  I will ask him on Monday and see what he says.  If there's a problem, I'll let you know.  "Okay, that'll be fine."

## 2018-12-26 ENCOUNTER — Telehealth: Payer: Self-pay | Admitting: *Deleted

## 2018-12-26 NOTE — Telephone Encounter (Signed)
DOS 01/02/2019; 40814 - KELLER BUNION IMPLANT LT FOOT  BCBS: Effective Date - 11/23/2018 - 06/17/2198   In-Network    Max Per Benefit Period Year-to-Date Remaining  CoInsurance  30%    Deductible  $3000.00 $3000.00  Out-Of-Pocket 3  $6000.00 $5727.11     In Network  [employer] Coinsurance Authorization Required  Not Applicable  48% per Service Year  No

## 2018-12-30 NOTE — Telephone Encounter (Signed)
Whatever she wants

## 2018-12-31 ENCOUNTER — Other Ambulatory Visit: Payer: Self-pay | Admitting: Podiatry

## 2018-12-31 MED ORDER — CLINDAMYCIN HCL 150 MG PO CAPS: 150.0000 mg | ORAL_CAPSULE | Freq: Three times a day (TID) | ORAL | 0 refills | Status: DC

## 2018-12-31 MED ORDER — ONDANSETRON HCL 4 MG PO TABS: 4.0000 mg | ORAL_TABLET | Freq: Three times a day (TID) | ORAL | 0 refills | Status: AC | PRN

## 2018-12-31 MED ORDER — HYDROMORPHONE HCL 4 MG PO TABS: 4.0000 mg | ORAL_TABLET | Freq: Four times a day (QID) | ORAL | 0 refills | Status: AC | PRN

## 2019-01-02 DIAGNOSIS — M2012 Hallux valgus (acquired), left foot: Secondary | ICD-10-CM | POA: Diagnosis not present

## 2019-01-05 ENCOUNTER — Telehealth: Payer: Self-pay | Admitting: *Deleted

## 2019-01-05 NOTE — Telephone Encounter (Signed)
"  I need to change my appointments.  You scheduled all my appointments in [place] at 1:30 pm.  I'm not going to be able to do that time.  I need to schedule them in [place] because I'm closer to [place].  Please call me."  I'm returning your call.  I changed your appointments to [facility].  Your appointment will be on Thursday, July 23, at 10:30 am.  "I appreciate it.  I guess they'll give me my other appointments when I come in?"  Yes, that is correct, they give you your appointment dates and times when you come in for your appointment.

## 2019-01-07 ENCOUNTER — Other Ambulatory Visit: Payer: BC Managed Care – PPO

## 2019-01-08 ENCOUNTER — Ambulatory Visit (INDEPENDENT_AMBULATORY_CARE_PROVIDER_SITE_OTHER): Payer: BC Managed Care – PPO | Admitting: Podiatry

## 2019-01-08 ENCOUNTER — Ambulatory Visit (INDEPENDENT_AMBULATORY_CARE_PROVIDER_SITE_OTHER): Payer: BC Managed Care – PPO

## 2019-01-08 ENCOUNTER — Other Ambulatory Visit: Payer: Self-pay

## 2019-01-08 ENCOUNTER — Encounter: Payer: Self-pay | Admitting: Podiatry

## 2019-01-08 VITALS — Temp 97.4°F

## 2019-01-08 DIAGNOSIS — Z09 Encounter for follow-up examination after completed treatment for conditions other than malignant neoplasm: Secondary | ICD-10-CM

## 2019-01-08 DIAGNOSIS — M2012 Hallux valgus (acquired), left foot: Secondary | ICD-10-CM

## 2019-01-08 NOTE — Progress Notes (Signed)
She presents today for her first postop visit date of surgery January 02, 2019 status post Jake Michaelis bunion implant left states my foot feels okay but the boot is really rubbing the side of my foot as she refers to the left foot.  States that she has redressed her dressing.  Has not get the foot wet.  Objective: Vital signs are stable she is alert oriented x3 dressed her dressing intact was removed demonstrates moderate edema no erythema cellulitis drainage or odor she has some ecchymosis just to the medial aspect of the foot.  Otherwise she has great range of motion of the first metatarsal phalangeal joint.  Assessment: Well-healing surgical foot.  Plan: Redressed dressed a compressive dressing today encourage range of motion activities and I will follow-up with her in 1 week

## 2019-01-15 ENCOUNTER — Encounter: Payer: Self-pay | Admitting: Podiatry

## 2019-01-15 ENCOUNTER — Other Ambulatory Visit: Payer: Self-pay

## 2019-01-15 ENCOUNTER — Ambulatory Visit (INDEPENDENT_AMBULATORY_CARE_PROVIDER_SITE_OTHER): Payer: BC Managed Care – PPO | Admitting: Podiatry

## 2019-01-15 VITALS — Temp 97.9°F

## 2019-01-15 DIAGNOSIS — M2012 Hallux valgus (acquired), left foot: Secondary | ICD-10-CM

## 2019-01-15 DIAGNOSIS — Z09 Encounter for follow-up examination after completed treatment for conditions other than malignant neoplasm: Secondary | ICD-10-CM

## 2019-01-15 NOTE — Progress Notes (Signed)
She presents today date of surgery 01/02/2019 status post Jake Michaelis bunion implant left foot.  States that have been feeling and feeling a little tired and exhausted lately.  She denies fever chills nausea vomiting muscle aches pains calf pain back pain chest pain shortness of breath.  Objective: Vital signs are stable she is alert and oriented x3 she still has swelling to the first metatarsal phalangeal joint of the left foot the blister has gone on to heal uneventfully and the swelling and ecchymosis to the plantar medial aspect of the foot is still present but is starting to become more diffuse.  Assessment well-healing surgical foot with considerable tenderness status post Jake Michaelis implant.  Plan: Encourage range of motion exercises keep foot elevated as much as possible stay off of it is much as possible continue use of the Darco shoe follow-up with her in 1 week.

## 2019-01-20 ENCOUNTER — Encounter: Payer: Self-pay | Admitting: Obstetrics and Gynecology

## 2019-01-20 ENCOUNTER — Other Ambulatory Visit: Payer: Self-pay

## 2019-01-20 ENCOUNTER — Ambulatory Visit: Payer: BC Managed Care – PPO | Admitting: Obstetrics and Gynecology

## 2019-01-20 VITALS — BP 114/88 | HR 108 | Ht 63.0 in | Wt 221.1 lb

## 2019-01-20 DIAGNOSIS — E611 Iron deficiency: Secondary | ICD-10-CM

## 2019-01-20 DIAGNOSIS — R0602 Shortness of breath: Secondary | ICD-10-CM

## 2019-01-20 DIAGNOSIS — R079 Chest pain, unspecified: Secondary | ICD-10-CM

## 2019-01-20 DIAGNOSIS — R5383 Other fatigue: Secondary | ICD-10-CM

## 2019-01-20 DIAGNOSIS — A599 Trichomoniasis, unspecified: Secondary | ICD-10-CM

## 2019-01-20 DIAGNOSIS — G479 Sleep disorder, unspecified: Secondary | ICD-10-CM

## 2019-01-20 MED ORDER — TINIDAZOLE 500 MG PO TABS: 2.0000 g | ORAL_TABLET | Freq: Once | ORAL | 1 refills | Status: AC

## 2019-01-20 NOTE — Progress Notes (Signed)
  Subjective:     Patient ID: Erika Shelton, caucasian female   DOB: October 05, 1965, 53 y.o.   MRN: [id number]  HPI Pt present in no distress with concerns for the following:  Ongoing intermittent chest heaviness and SOB ongoing for the past couple months. She has a Cardiology appointment after this appointment.   Pt feels like she has gained weight, but actual weight gain since Oct 2019 is 4 lbs per EMR.   Insomnia: pt reports having difficulty falling asleep and then wakes up frequently throughout the night.   Fatigue: pt reports increase sleep during the day and coming home from work and going right to bed. Concerned with pale appearance in color with hx of needing iron infusions.   Foot surgery s/p 1 month - healing well Moved to [place] recently to take care of her aunt and began a new job working for [employer]. Has increased stress with work, moving, brother that is incarcerated, divorce that was final in April.   Review of Systems  Constitutional: Positive for fever.  Respiratory: Positive for shortness of breath.   Cardiovascular: Positive for chest pain and palpitations.  Genitourinary: Positive for vaginal bleeding.  Skin: Positive for pallor.  Psychiatric/Behavioral: Positive for dysphoric mood and sleep disturbance. The patient is nervous/anxious.   All other systems reviewed and are negative.      Objective:   Physical Exam A&Ox4 Well groomed female in no distress Blood pressure 114/88, pulse (!) 108, height 5\' 3"  (1.6 m), weight 221 lb 1.6 oz (100.3 kg). Body mass index is 39.17 kg/m.   Throat: thyroid without abnormality upon palpation and swallow test.  Pulmonary: normal chest expansion, Lungs CTA b/l. Cardiac: S1, S2 heard, RRR, no murmurs heard.  Pelvic exam: normal external genitalia, vulva, vagina (slightly atrophic), cervix, uterus surgically absent. Speculum exam performed. Microscopic wet-mount exam shows trichomonads. Increased WBC, occasional  RBC.    Assessment:     Trichomonas BMI 39 Sleep disturbances Fatigue H/o anemia shortness of breath Chest heaviness     Plan:   counseled on findings, treatment and need for TOC in 4 weeks. Information in AVS for patient to review. Patient tearful upon diagnosis and states last intercourse was over a year ago, and she was not aware of any infidelity.    Tinidazole 2g PO x1 Rx, return in 4wks for TOC and AE Labs drawn - will follow up accordingly. Cardiology consult from PCP, appt.today   Silvestre Mesi, SNM

## 2019-01-20 NOTE — Patient Instructions (Addendum)
Trichomoniasis Trichomoniasis is an STI (sexually transmitted infection) that can affect both women and men. In women, the outer area of the female genitalia (vulva) and the vagina are affected. In men, mainly the penis is affected, but the prostate and other reproductive organs can also be involved.  This condition can be treated with medicine. It often has no symptoms (is asymptomatic), especially in men. If not treated, trichomoniasis can last for months or years. What are the causes? This condition is caused by a parasite called Trichomonas vaginalis. Trichomoniasis most often spreads from person to person (is contagious) through sexual contact. What increases the risk? The following factors may make you more likely to develop this condition:  Having unprotected sex.  Having sex with a partner who has trichomoniasis.  Having multiple sexual partners.  Having had previous trichomoniasis infections or other STIs. What are the signs or symptoms? In women, symptoms of trichomoniasis include:  Abnormal vaginal discharge that is clear, white, gray, or yellow-green and foamy and has an unusual "fishy" odor.  Itching and irritation of the vagina and vulva.  Burning or pain during urination or sex.  Redness and swelling of the genitals. In men, symptoms of trichomoniasis include:  Penile discharge that may be foamy or contain pus.  Pain in the penis. This may happen only when urinating.  Itching or irritation inside the penis.  Burning after urination or ejaculation. How is this diagnosed? In women, this condition may be found during a routine Pap test or physical exam. It may be found in men during a routine physical exam. Your health care provider may do tests to help diagnose this infection, such as:  Urine tests (men and women).  The following in women: ? Testing the pH of the vagina. ? A vaginal swab test that checks for the Trichomonas vaginalis parasite. ? Testing vaginal  secretions. Your health care provider may test you for other STIs, including HIV (human immunodeficiency virus). How is this treated? This condition is treated with medicine taken by mouth (orally), such as metronidazole or tinidazole, to fight the infection. Your sexual partner(s) also need to be tested and treated.  If you are a woman and you plan to become pregnant or think you may be pregnant, tell your health care provider right away. Some medicines that are used to treat the infection should not be taken during pregnancy. Your health care provider may recommend over-the-counter medicines or creams to help relieve itching or irritation. You may be tested for infection again 3 months after treatment. Follow these instructions at home:  Take and use over-the-counter and prescription medicines, including creams, only as told by your health care provider.  Take your antibiotic medicine as told by your health care provider. Do not stop taking the antibiotic even if you start to feel better.  Do not have sex until 7-10 days after you finish your medicine, or until your health care provider approves. Ask your health care provider when you may start to have sex again.  (Women) Do not douche or wear tampons while you have the infection.  Discuss your infection with your sexual partner(s). Make sure that your partner gets tested and treated, if necessary.  Keep all follow-up visits as told by your health care provider. This is important. How is this prevented?   Use condoms every time you have sex. Using condoms correctly and consistently can help protect against STIs.  Avoid having multiple sexual partners.  Talk with your sexual partner about any  symptoms that either of you may have, as well as any history of STIs.  Get tested for STIs and STDs (sexually transmitted diseases) before you have sex. Ask your partner to do the same.  Do not have sexual contact if you have symptoms of  trichomoniasis or another STI. Contact a health care provider if:  You still have symptoms after you finish your medicine.  You develop pain in your abdomen.  You have pain when you urinate.  You have bleeding after sex.  You develop a rash.  You feel nauseous or you vomit.  You plan to become pregnant or think you may be pregnant. Summary  Trichomoniasis is an STI (sexually transmitted infection) that can affect both women and men.  This condition often has no symptoms (is asymptomatic), especially in men.  Without treatment, this condition can last for months or years.  You should not have sex until 7-10 days after you finish your medicine, or until your health care provider approves. Ask your health care provider when you may start to have sex again.  Discuss your infection with your sexual partner(s). Make sure that your partner gets tested and treated, if necessary. This information is not intended to replace advice given to you by your health care provider. Make sure you discuss any questions you have with your health care provider. Document Released: 11/28/2000 Document Revised: 03/18/2018 Document Reviewed: 03/18/2018 Elsevier Patient Education  Section.  Fatigue If you have fatigue, you feel tired all the time and have a lack of energy or a lack of motivation. Fatigue may make it difficult to start or complete tasks because of exhaustion. In general, occasional or mild fatigue is often a normal response to activity or life. However, long-lasting (chronic) or extreme fatigue may be a symptom of a medical condition. Follow these instructions at home: General instructions  Watch your fatigue for any changes.  Go to bed and get up at the same time every day.  Avoid fatigue by pacing yourself during the day and getting enough sleep at night.  Maintain a healthy weight. Medicines  Take over-the-counter and prescription medicines only as told by your health  care provider.  Take a multivitamin, if told by your health care provider.  Do not use herbal or dietary supplements unless they are approved by your health care provider. Activity   Exercise regularly, as told by your health care provider.  Use or practice techniques to help you relax, such as yoga, tai chi, meditation, or massage therapy. Eating and drinking   Avoid heavy meals in the evening.  Eat a well-balanced diet, which includes lean proteins, whole grains, plenty of fruits and vegetables, and low-fat dairy products.  Avoid consuming too much caffeine.  Avoid the use of alcohol.  Drink enough fluid to keep your urine pale yellow. Lifestyle  Change situations that cause you stress. Try to keep your work and personal schedule in balance.  Do not use any products that contain nicotine or tobacco, such as cigarettes and e-cigarettes. If you need help quitting, ask your health care provider.  Do not use drugs. Contact a health care provider if:  Your fatigue does not get better.  You have a fever.  You suddenly lose or gain weight.  You have headaches.  You have trouble falling asleep or sleeping through the night.  You feel angry, guilty, anxious, or sad.  You are unable to have a bowel movement (constipation).  Your skin is dry.  You have swelling in your legs or another part of your body. Get help right away if:  You feel confused.  Your vision is blurry.  You feel faint or you pass out.  You have a severe headache.  You have severe pain in your abdomen, your back, or the area between your waist and hips (pelvis).  You have chest pain, shortness of breath, or an irregular or fast heartbeat.  You are unable to urinate, or you urinate less than normal.  You have abnormal bleeding, such as bleeding from the rectum, vagina, nose, lungs, or nipples.  You vomit blood.  You have thoughts about hurting yourself or others. If you ever feel like you  may hurt yourself or others, or have thoughts about taking your own life, get help right away. You can go to your nearest emergency department or call:  Your local emergency services (911 in the [place]  A suicide crisis helpline, such as the [place] at (740)601-9090. This is open 24 hours a day. Summary  If you have fatigue, you feel tired all the time and have a lack of energy or a lack of motivation.  Fatigue may make it difficult to start or complete tasks because of exhaustion.  Long-lasting (chronic) or extreme fatigue may be a symptom of a medical condition.  Exercise regularly, as told by your health care provider.  Change situations that cause you stress. Try to keep your work and personal schedule in balance. This information is not intended to replace advice given to you by your health care provider. Make sure you discuss any questions you have with your health care provider. Document Released: 04/01/2007 Document Revised: 09/25/2018 Document Reviewed: 02/27/2017 Elsevier Patient Education  2020 [employer].

## 2019-01-22 LAB — CBC
Hematocrit: 40.5 % (ref 34.0–46.6)
Hemoglobin: 13.7 g/dL (ref 11.1–15.9)
MCH: 29.2 pg (ref 26.6–33.0)
MCHC: 33.8 g/dL (ref 31.5–35.7)
MCV: 86 fL (ref 79–97)
Platelets: 363 10*3/uL (ref 150–450)
RBC: 4.69 x10E6/uL (ref 3.77–5.28)
RDW: 13.1 % (ref 11.7–15.4)
WBC: 8.7 10*3/uL (ref 3.4–10.8)

## 2019-01-22 LAB — COMPREHENSIVE METABOLIC PANEL
ALT: 20 IU/L (ref 0–32)
AST: 19 IU/L (ref 0–40)
Albumin/Globulin Ratio: 1.6 (ref 1.2–2.2)
Albumin: 4 g/dL (ref 3.8–4.9)
Alkaline Phosphatase: 78 IU/L (ref 39–117)
BUN/Creatinine Ratio: 12 (ref 9–23)
BUN: 12 mg/dL (ref 6–24)
Bilirubin Total: 0.3 mg/dL (ref 0.0–1.2)
CO2: 26 mmol/L (ref 20–29)
Calcium: 9.3 mg/dL (ref 8.7–10.2)
Chloride: 97 mmol/L (ref 96–106)
Creatinine, Ser: 1 mg/dL (ref 0.57–1.00)
GFR calc Af Amer: 74 mL/min/{1.73_m2} (ref >59)
GFR calc non Af Amer: 64 mL/min/{1.73_m2} (ref >59)
Globulin, Total: 2.5 g/dL (ref 1.5–4.5)
Glucose: 87 mg/dL (ref 65–99)
Potassium: 4.3 mmol/L (ref 3.5–5.2)
Sodium: 138 mmol/L (ref 134–144)
Total Protein: 6.5 g/dL (ref 6.0–8.5)

## 2019-01-22 LAB — THYROID PANEL WITH TSH
Free Thyroxine Index: 1.3 (ref 1.2–4.9)
T3 Uptake Ratio: 22 % — ABNORMAL LOW (ref 24–39)
T4, Total: 5.8 ug/dL (ref 4.5–12.0)
TSH: 2.97 u[IU]/mL (ref 0.450–4.500)

## 2019-01-22 LAB — HEPATITIS PANEL, ACUTE
Hep A IgM: NEGATIVE
Hep B C IgM: NEGATIVE
Hep C Virus Ab: <0.1 s/co ratio (ref 0.0–0.9)
Hepatitis B Surface Ag: NEGATIVE

## 2019-01-22 LAB — VITAMIN B12: Vitamin B-12: 255 pg/mL (ref 232–1245)

## 2019-01-22 LAB — RPR: RPR Ser Ql: NONREACTIVE

## 2019-01-22 LAB — FOLATE: Folate: 6.3 ng/mL (ref >3.0)

## 2019-01-22 LAB — VITAMIN D 25 HYDROXY (VIT D DEFICIENCY, FRACTURES): Vit D, 25-Hydroxy: 43.4 ng/mL (ref 30.0–100.0)

## 2019-01-22 LAB — HIV ANTIBODY (ROUTINE TESTING W REFLEX): HIV Screen 4th Generation wRfx: NONREACTIVE

## 2019-01-22 LAB — URINE CULTURE

## 2019-01-22 LAB — FERRITIN: Ferritin: 66 ng/mL (ref 15–150)

## 2019-01-23 ENCOUNTER — Telehealth: Payer: Self-pay | Admitting: Obstetrics and Gynecology

## 2019-01-23 ENCOUNTER — Other Ambulatory Visit: Payer: Self-pay | Admitting: Certified Nurse Midwife

## 2019-01-23 MED ORDER — NITROFURANTOIN MONOHYD MACRO 100 MG PO CAPS: 100.0000 mg | ORAL_CAPSULE | Freq: Two times a day (BID) | ORAL | 0 refills | Status: AC

## 2019-01-23 NOTE — Progress Notes (Signed)
Orders placed for Macrobid for UTI.   Philip Aspen, CNM

## 2019-01-23 NOTE — Telephone Encounter (Signed)
Looks like her labs were normal except urine shows UTI, I put in an order for Macrobid. Please let her know. I can not release them since they are not in my in box.   Thanks,  Deneise Lever

## 2019-01-23 NOTE — Telephone Encounter (Signed)
Patient called and stated that she wants to know if her results are in yet. Pt is aware Melody and Amy are out of office ptis requesting a different provider to review and release results to her so she does not have to wait until after Tuesday of next week. Please advise.

## 2019-01-28 ENCOUNTER — Other Ambulatory Visit: Payer: Self-pay | Admitting: Obstetrics and Gynecology

## 2019-01-28 ENCOUNTER — Other Ambulatory Visit: Payer: BC Managed Care – PPO

## 2019-01-29 ENCOUNTER — Other Ambulatory Visit: Payer: Self-pay

## 2019-01-29 ENCOUNTER — Encounter: Payer: Self-pay | Admitting: Podiatry

## 2019-01-29 ENCOUNTER — Ambulatory Visit (INDEPENDENT_AMBULATORY_CARE_PROVIDER_SITE_OTHER): Payer: BC Managed Care – PPO

## 2019-01-29 ENCOUNTER — Ambulatory Visit (INDEPENDENT_AMBULATORY_CARE_PROVIDER_SITE_OTHER): Payer: Self-pay | Admitting: Podiatry

## 2019-01-29 VITALS — Temp 97.3°F

## 2019-01-29 DIAGNOSIS — Z09 Encounter for follow-up examination after completed treatment for conditions other than malignant neoplasm: Secondary | ICD-10-CM

## 2019-01-29 DIAGNOSIS — M2012 Hallux valgus (acquired), left foot: Secondary | ICD-10-CM

## 2019-01-29 MED ORDER — ACETAMINOPHEN-CODEINE 300-30 MG PO TABS: 1.0000 | ORAL_TABLET | ORAL | 0 refills | Status: DC | PRN

## 2019-01-29 NOTE — Progress Notes (Signed)
She presents today date of surgery is 01/02/2019 status post Erika Shelton bunion implant left foot.  States that is doing okay but is still drains so I am still changing my dressing.  Objective: Vital signs are stable alert and oriented x3.  Pulses are palpable.  Neurologic sensorium is intact surgical foot does demonstrate some edema to the surgical site the year is some dehiscence the distal aspect of the wound but she has good range of motion of the first metatarsal phalangeal joint.  Is mildly warm to touch does not appear to be infected.  Assessment: Slowly healing surgical foot 1 month status post Keller arthroplasty.  Mild dehiscence of the wound.  Plan: Discussed etiology pathology and surgical therapies this point time provided her with more pain medication she will change dressing with small amount of Neosporin on a daily basis she will remain out of work until this wound is completely closed and I am satisfied that she is able to go back without complication.

## 2019-02-02 ENCOUNTER — Other Ambulatory Visit: Payer: Self-pay | Admitting: Podiatry

## 2019-02-05 ENCOUNTER — Other Ambulatory Visit: Payer: Self-pay

## 2019-02-05 ENCOUNTER — Ambulatory Visit (INDEPENDENT_AMBULATORY_CARE_PROVIDER_SITE_OTHER): Payer: BC Managed Care – PPO

## 2019-02-05 ENCOUNTER — Ambulatory Visit (INDEPENDENT_AMBULATORY_CARE_PROVIDER_SITE_OTHER): Payer: BC Managed Care – PPO | Admitting: Podiatry

## 2019-02-05 VITALS — Temp 97.8°F

## 2019-02-05 DIAGNOSIS — M2012 Hallux valgus (acquired), left foot: Secondary | ICD-10-CM

## 2019-02-05 DIAGNOSIS — Z09 Encounter for follow-up examination after completed treatment for conditions other than malignant neoplasm: Secondary | ICD-10-CM

## 2019-02-05 NOTE — Progress Notes (Signed)
She presents today for postop visit she had a nonhealing wound to the dorsal aspect of her first metatarsal phalangeal joint for a Keller arthroplasty with a single silicone implant was performed.  She denies fever chills nausea vomiting states that she is doing okay.  Objective: Vital signs stable she is alert and oriented x3 wound distalmost aspect of the wound appears to be demonstrating some fibrin deposition but it appears to be dry and will see any drainage at this point.  She has good range of motion passively and actively of the first metatarsophalangeal joint left.  Assessment: Well-healing surgical foot left.  Plan: Encourage range of motion exercises continue to cover during the day with a dry sterile dressing and I will follow-up with her in 2 weeks.

## 2019-02-11 ENCOUNTER — Other Ambulatory Visit: Payer: BC Managed Care – PPO

## 2019-02-12 ENCOUNTER — Other Ambulatory Visit: Payer: BC Managed Care – PPO

## 2019-02-19 ENCOUNTER — Encounter: Payer: Self-pay | Admitting: Obstetrics and Gynecology

## 2019-02-19 ENCOUNTER — Other Ambulatory Visit: Payer: Self-pay

## 2019-02-19 ENCOUNTER — Encounter: Payer: Self-pay | Admitting: Podiatry

## 2019-02-19 ENCOUNTER — Ambulatory Visit: Payer: Self-pay | Admitting: Obstetrics and Gynecology

## 2019-02-19 ENCOUNTER — Ambulatory Visit (INDEPENDENT_AMBULATORY_CARE_PROVIDER_SITE_OTHER): Payer: Self-pay

## 2019-02-19 ENCOUNTER — Other Ambulatory Visit (HOSPITAL_COMMUNITY)
Admission: RE | Admit: 2019-02-19 | Discharge: 2019-02-19 | Disposition: A | Discharge status: Home / Self Care | Payer: Self-pay | Source: Ambulatory Visit | Attending: Obstetrics and Gynecology | Admitting: Obstetrics and Gynecology

## 2019-02-19 ENCOUNTER — Ambulatory Visit (INDEPENDENT_AMBULATORY_CARE_PROVIDER_SITE_OTHER): Payer: Self-pay | Admitting: Podiatry

## 2019-02-19 VITALS — BP 101/76 | HR 93 | Ht 63.0 in | Wt 226.9 lb

## 2019-02-19 DIAGNOSIS — M205X2 Other deformities of toe(s) (acquired), left foot: Secondary | ICD-10-CM

## 2019-02-19 DIAGNOSIS — Z01419 Encounter for gynecological examination (general) (routine) without abnormal findings: Secondary | ICD-10-CM | POA: Insufficient documentation

## 2019-02-19 DIAGNOSIS — G479 Sleep disorder, unspecified: Secondary | ICD-10-CM

## 2019-02-19 DIAGNOSIS — F331 Major depressive disorder, recurrent, moderate: Secondary | ICD-10-CM

## 2019-02-19 DIAGNOSIS — Z09 Encounter for follow-up examination after completed treatment for conditions other than malignant neoplasm: Secondary | ICD-10-CM

## 2019-02-19 MED ORDER — VENLAFAXINE HCL ER 150 MG PO CP24: 150.0000 mg | ORAL_CAPSULE | Freq: Three times a day (TID) | ORAL | 6 refills | Status: AC

## 2019-02-19 MED ORDER — PROMETHAZINE HCL 25 MG PO TABS: 25.0000 mg | ORAL_TABLET | Freq: Four times a day (QID) | ORAL | 2 refills | Status: AC | PRN

## 2019-02-19 MED ORDER — ARIPIPRAZOLE 5 MG PO TABS: 5.0000 mg | ORAL_TABLET | Freq: Every day | ORAL | 6 refills | Status: AC

## 2019-02-19 MED ORDER — TRIAZOLAM 0.25 MG PO TABS: 0.2500 mg | ORAL_TABLET | Freq: Every evening | ORAL | 0 refills | Status: DC | PRN

## 2019-02-19 NOTE — Progress Notes (Signed)
Subjective:   Erika Shelton is a 53 y.o. G62P0 Caucasian female here for a routine well-woman exam.  No LMP recorded. Patient has had a hysterectomy.    Current complaints: depression worsening since lost job (to start new job next week). Tearful all the time and can't sleep more than 2-3 hours even with increasing ativan dose. Lives with [name] now and is caring for her. Divorce is final. Also tries to help brother who is currently incarcerated. Is struggling with not being able to go to church as that is a source of comfort for her.  PCP: none       does desire labs  Social History: Sexual: heterosexual Marital Status: divorced Living situation: alone Occupation: unknown occupation Tobacco/alcohol: no tobacco use Illicit drugs: no history of illicit drug use  The following portions of the patient's history were reviewed and updated as appropriate: allergies, current medications, past family history, past medical history, past social history, past surgical history and problem list.  Past Medical History Past Medical History:  Diagnosis Date  . Anxiety   . Asthma   . Complication of anesthesia    nausea  . Headache   . Urinary incontinence     Past Surgical History Past Surgical History:  Procedure Laterality Date  . BLADDER SURGERY     X2   . COLONOSCOPY WITH PROPOFOL N/A 07/26/2017   Procedure: COLONOSCOPY WITH PROPOFOL;  Surgeon: Manya Silvas, MD;  Location: [facility] ENDOSCOPY;  Service: Endoscopy;  Laterality: N/A;  . ESOPHAGOGASTRODUODENOSCOPY (EGD) WITH PROPOFOL N/A 07/26/2017   Procedure: ESOPHAGOGASTRODUODENOSCOPY (EGD) WITH PROPOFOL;  Surgeon: Manya Silvas, MD;  Location: [facility] ENDOSCOPY;  Service: Endoscopy;  Laterality: N/A;  . FOOT SURGERY Bilateral   . interstem     . LAPAROSCOPIC GASTRIC BANDING    . LAPAROSCOPIC GASTRIC RESTRICTIVE DUODENAL PROCEDURE (DUODENAL SWITCH) N/A 12/07/2014   Procedure: LAPAROSCOPIC GASTRIC RESTRICTIVE DUODENAL PROCEDURE (DUODENAL  SWITCH);  Surgeon: Bonner Puna, MD;  Location: [facility] ORS;  Service: General;  Laterality: N/A;  . LAPAROSCOPIC GASTRIC SLEEVE RESECTION    . VAGINAL HYSTERECTOMY      Gynecologic History G0P0  No LMP recorded. Patient has had a hysterectomy. Contraception: abstinence Last Pap: 2017. Results were: normal Last mammogram: 2018. Results were: normal   Obstetric History OB History  Gravida Para Term Preterm AB Living  0            SAB TAB Ectopic Multiple Live Births               Current Medications Current Outpatient Medications on File Prior to Visit  Medication Sig Dispense Refill  . acyclovir (ZOVIRAX) 800 MG tablet Take 1 tablet (800 mg total) by mouth 3 (three) times daily. 42 tablet 1  . albuterol (PROVENTIL HFA;VENTOLIN HFA) 108 (90 Base) MCG/ACT inhaler Inhale 2 puffs into the lungs every 6 (six) hours as needed for wheezing or shortness of breath. 1 Inhaler 2  . estradiol (ESTRACE) 0.1 MG/GM vaginal cream PLACE 2 GRAMS VAGINALLY DAILY. 42.5 g 1  . levocetirizine (XYZAL) 5 MG tablet Take 1 tablet (5 mg total) by mouth every evening. 90 tablet 1  . levothyroxine (SYNTHROID, LEVOTHROID) 50 MCG tablet TAKE 1 TABLET BY MOUTH DAILY BEFORE BREAKFAST. 90 tablet 4  . LORazepam (ATIVAN) 2 MG tablet TAKE 1 TABLET BY MOUTH EVERY 6 HOURS AS NEEDED ANXETY 90 tablet 1  . ondansetron (ZOFRAN) 4 MG tablet Take 1 tablet (4 mg total) by mouth every 8 (eight) hours as  needed for nausea or vomiting. 20 tablet 0  . oxyCODONE-acetaminophen (PERCOCET/ROXICET) 5-325 MG tablet Take 1 tablet by mouth every 4 (four) hours as needed for severe pain. 20 tablet 0  . progesterone (PROMETRIUM) 200 MG capsule TAKE 1 CAPSULE BY MOUTH ONCE DAILY 90 capsule 1  . venlafaxine XR (EFFEXOR-XR) 75 MG 24 hr capsule Take 1 capsule (75 mg total) by mouth 3 (three) times daily. 360 capsule 2  . Vitamin D, Ergocalciferol, (DRISDOL) 1.25 MG (50000 UT) CAPS capsule TAKE 1 CAPSULE BY MOUTH ONCE WEEKLY 4 capsule 11  .  Acetaminophen-Codeine (TYLENOL/CODEINE #3) 300-30 MG tablet Take 1 tablet by mouth every 4 (four) hours as needed for pain. (Patient not taking: Reported on 02/19/2019) 30 tablet 0  . buPROPion (WELLBUTRIN XL) 150 MG 24 hr tablet Take 1 tablet (150 mg total) by mouth daily. (Patient not taking: Reported on 02/19/2019) 30 tablet 6  . celecoxib (CELEBREX) 200 MG capsule TAKE 1 CAPSULE BY MOUTH TWICE A DAY (Patient not taking: Reported on 02/19/2019) 60 capsule 1  . cyanocobalamin (,VITAMIN B-12,) 1000 MCG/ML injection Inject 1 mL (1,000 mcg total) into the muscle every 30 (thirty) days. (Patient not taking: Reported on 02/19/2019) 10 mL 1  . fluconazole (DIFLUCAN) 150 MG tablet     . Methen-Hyosc-Meth Blue-Na Phos (UROGESIC-BLUE) 81.6 MG TABS Take 1 tablet (81.6 mg total) by mouth every 6 (six) hours as needed. (Patient not taking: Reported on 02/19/2019) 30 tablet 1  . montelukast (SINGULAIR) 10 MG tablet TAKE 1 TABLET (10 MG TOTAL) BY MOUTH AT BEDTIME. (Patient not taking: Reported on 02/19/2019) 30 tablet 6  . predniSONE (DELTASONE) 20 MG tablet     . tinidazole (TINDAMAX) 500 MG tablet     . trimethoprim (TRIMPEX) 100 MG tablet Take 1 tablet (100 mg total) by mouth daily. (Patient not taking: Reported on 02/19/2019) 30 tablet 11   No current facility-administered medications on file prior to visit.     Review of Systems Patient denies any headaches, blurred vision, shortness of breath, chest pain, abdominal pain, problems with bowel movements, urination, or intercourse.  Objective:  BP 101/76   Pulse 93   Ht 5\' 3"  (1.6 m)   Wt 226 lb 14.4 oz (102.9 kg)   BMI 40.19 kg/m  Physical Exam  General:  Well developed, well nourished, no acute distress. She is alert and oriented x3. Skin:  Warm and dry Neck:  Midline trachea, no thyromegaly or nodules Cardiovascular: Regular rate and rhythm, no murmur heard Lungs:  Effort normal, all lung fields clear to auscultation bilaterally Breasts:  No dominant  palpable mass, retraction, or nipple discharge Abdomen:  Soft, non tender, no hepatosplenomegaly or masses Pelvic:  External genitalia is normal in appearance.  The vagina is normal in appearance. The cervix is bulbous, no CMT.  Thin prep pap is done with HR HPV cotesting. Uterus is surgically absent.  No adnexal masses or tenderness noted. Microscopic wet-mount exam shows negative for pathogens, normal epithelial cells. Extremities:  No swelling or varicosities noted Psych:  She has a normal mood and affect Urinalysis    Component Value Date/Time   COLORURINE YELLOW (A) 01/09/2018 1650   APPEARANCEUR Clear 07/14/2018 1400   LABSPEC 1.013 01/09/2018 1650   PHURINE 5.0 01/09/2018 1650   GLUCOSEU Negative 07/14/2018 1400   HGBUR NEGATIVE 01/09/2018 1650   BILIRUBINUR Negative 07/14/2018 1400   KETONESUR NEGATIVE 01/09/2018 1650   PROTEINUR Negative 07/14/2018 1400   PROTEINUR NEGATIVE 01/09/2018 1650   UROBILINOGEN  0.2 06/20/2018 1409   UROBILINOGEN 1.0 10/14/2008 1509   NITRITE Negative 07/14/2018 1400   NITRITE NEGATIVE 01/09/2018 1650   LEUKOCYTESUR 1+ (A) 07/14/2018 1400    Assessment:   Healthy well-woman exam Depression Sleep disturbance BMI 40    Plan:  Counseled at length regarding medication for depression and will change effexor dosing to 300mg  in am and 150mg  in pm, adding abilify 5mg  in am. Also will change ativan to halcion at bedtime. Recommended counseling to help deal with life stressors and will seek therapist near [place] where she is now living (when insurance kicks in in 90 days). Encouraged regular exercise and healthy eating.  F/U 1 year for AE, or sooner if needed Mammogram past due/ordered   Rockney Ghee, CNM

## 2019-02-19 NOTE — Progress Notes (Signed)
She presents today 6 weeks status post Keller arthroplasty single silicone implant left foot.  States that it still looks red and is still bit sore.  She denies fever chills nausea vomiting muscle aches pains.  Objective: Vital signs are stable alert and oriented x3 there is no erythema to some mild edema no cellulitis drainage odor incision site appears to be drying up and healing very nicely.  Assessment: Well-healing surgical foot left.  Plan: Great range of motion is noted today instructed her on how to increase the range of motion.  Will allow her to get back to work follow-up with her in 70 month

## 2019-02-20 ENCOUNTER — Encounter: Payer: BC Managed Care – PPO | Admitting: Obstetrics and Gynecology

## 2019-02-20 ENCOUNTER — Ambulatory Visit: Payer: BC Managed Care – PPO | Admitting: Podiatry

## 2019-02-25 ENCOUNTER — Telehealth: Payer: Self-pay | Admitting: Obstetrics and Gynecology

## 2019-02-25 LAB — CYTOLOGY - PAP
Chlamydia: NEGATIVE
Diagnosis: NEGATIVE
Neisseria Gonorrhea: NEGATIVE
Trichomonas: NEGATIVE

## 2019-02-25 NOTE — Telephone Encounter (Signed)
The patient called and stated that she needs to speak with Amy in regards to her still not being able to get any sleep. The patient stated that another medication that she is taking may be the cause of this issue. Pt is requesting a call back today if possible. Please advise.

## 2019-02-26 ENCOUNTER — Encounter: Payer: Self-pay | Admitting: *Deleted

## 2019-02-26 NOTE — Telephone Encounter (Signed)
Sent pt mcm-ac 

## 2019-02-27 ENCOUNTER — Other Ambulatory Visit: Payer: Self-pay | Admitting: Obstetrics and Gynecology

## 2019-03-19 ENCOUNTER — Ambulatory Visit (INDEPENDENT_AMBULATORY_CARE_PROVIDER_SITE_OTHER): Payer: Self-pay | Admitting: Podiatry

## 2019-03-19 ENCOUNTER — Ambulatory Visit (INDEPENDENT_AMBULATORY_CARE_PROVIDER_SITE_OTHER): Payer: Self-pay

## 2019-03-19 ENCOUNTER — Encounter: Payer: Self-pay | Admitting: Podiatry

## 2019-03-19 ENCOUNTER — Other Ambulatory Visit: Payer: Self-pay

## 2019-03-19 DIAGNOSIS — M205X2 Other deformities of toe(s) (acquired), left foot: Secondary | ICD-10-CM

## 2019-03-19 DIAGNOSIS — Z09 Encounter for follow-up examination after completed treatment for conditions other than malignant neoplasm: Secondary | ICD-10-CM

## 2019-03-21 ENCOUNTER — Encounter: Payer: Self-pay | Admitting: Podiatry

## 2019-03-21 NOTE — Progress Notes (Signed)
She presents today for a postop visit date of surgery January 02, 2019 status post Erika Shelton arthroplasty with single silicone implant left foot states that is just does not seem to be doing good so swollen and tight all the time.  She denies fever chills nausea vomiting muscle aches pains calf pain back pain chest pain shortness of breath.  Objective: Pulses are strongly palpable.  She has mild edema about that leg no calf pain.  She has good range of motion of the first metatarsal phalangeal joint with the implant once you work the joint enough.  Is not warm to the touch the foot is moderately swollen but there is considerable scar tissue holding the joint in place.  More than likely this come about because of the wound that was present postoperatively.  Assessment: Well-healing surgical foot radiographs demonstrate this.  Plan: At this point I highly recommend physical therapy and she will give [place] a call back with the physical therapist that she would like to use and we will make that appropriate referral.  She will need range of motion exercises breaking up of scar tissue and gait training.

## 2019-03-26 ENCOUNTER — Other Ambulatory Visit: Payer: Self-pay | Admitting: Obstetrics and Gynecology

## 2019-04-25 ENCOUNTER — Other Ambulatory Visit: Payer: Self-pay | Admitting: Obstetrics and Gynecology

## 2019-04-29 ENCOUNTER — Telehealth: Payer: Self-pay | Admitting: Obstetrics and Gynecology

## 2019-04-29 ENCOUNTER — Telehealth: Payer: Self-pay

## 2019-04-29 MED ORDER — TRIAZOLAM 0.25 MG PO TABS: 0.2500 mg | ORAL_TABLET | Freq: Every evening | ORAL | 2 refills | Status: AC | PRN

## 2019-04-29 NOTE — Telephone Encounter (Signed)
The patient called and stated that she recently heard about Melody's departure. The patient was upset and stated that she has seen Melody for 16 years. Pt stated that the only medication she needs refilled is her sleep medication triazolam (HALCION) 0.25 MG tablet [8139]. The patient stated that she lives in [place] and just needs a little time to find a new provider in her area. Pt is requesting a call back from a nurse or provider if there are any issues with filling her sleep medication. Please advise.

## 2019-04-29 NOTE — Telephone Encounter (Signed)
mychart message sent to patient with Dr Dessie Coma instructions.

## 2019-04-29 NOTE — Telephone Encounter (Signed)
Please inform patient that we are also saddened by her departure and that we want to continue to offer the best care to her patients in her absence.  I will refill her medication for 3 months which should give her time to become established with a new provider.  If she changes her mind and decides to remain at [facility], then we will continue to manage her with the same great care she was receiving before.    Dr. Marcelline Mates

## 2019-04-29 NOTE — Telephone Encounter (Signed)
LMTRC

## 2019-04-30 NOTE — Telephone Encounter (Signed)
LMTRC

## 2019-05-01 NOTE — Telephone Encounter (Signed)
Someone picked up but did not answer. Will try again later.

## 2019-05-04 NOTE — Telephone Encounter (Signed)
Pt aware.

## 2019-06-23 ENCOUNTER — Other Ambulatory Visit: Payer: Self-pay | Admitting: Obstetrics and Gynecology

## 2019-06-23 NOTE — Telephone Encounter (Signed)
Patient received a prescription with 2 refills last month until she found a new provider. Not sure why we are getting a refill request for this. Please contact pharmacy.

## 2019-06-25 NOTE — Telephone Encounter (Signed)
Patient only received 1 refill of 30 tabs in November. I notified patient that Dr. Marcelline Mates prescribed the meds with 2 refills. Patient says pharmacy refuses to give her the 2nd refill. Would like the nurse to contact the pharmacy for her.   Patient says she plans to continue care with Dr. Marcelline Mates. Started a new job and is waiting for her medical insurance to begin.

## 2019-07-01 NOTE — Telephone Encounter (Signed)
Spoke with pt concerning he medication issues and was informed that the medication had been fixed.

## 2019-07-01 NOTE — Telephone Encounter (Signed)
Called pharmacy to check on the status of the medication and was informed that the pt had transferred the medication to another pharmacy.

## 2019-07-28 ENCOUNTER — Other Ambulatory Visit: Payer: Self-pay | Admitting: Obstetrics and Gynecology

## 2020-05-31 ENCOUNTER — Other Ambulatory Visit: Payer: Self-pay | Admitting: Podiatry

## 2020-05-31 ENCOUNTER — Ambulatory Visit (INDEPENDENT_AMBULATORY_CARE_PROVIDER_SITE_OTHER): Payer: BC Managed Care – PPO | Admitting: Podiatry

## 2020-05-31 ENCOUNTER — Ambulatory Visit (INDEPENDENT_AMBULATORY_CARE_PROVIDER_SITE_OTHER): Payer: BC Managed Care – PPO

## 2020-05-31 ENCOUNTER — Other Ambulatory Visit: Payer: Self-pay

## 2020-05-31 DIAGNOSIS — M76821 Posterior tibial tendinitis, right leg: Secondary | ICD-10-CM

## 2020-05-31 DIAGNOSIS — M76822 Posterior tibial tendinitis, left leg: Secondary | ICD-10-CM

## 2020-05-31 DIAGNOSIS — M778 Other enthesopathies, not elsewhere classified: Secondary | ICD-10-CM

## 2020-05-31 MED ORDER — DEXAMETHASONE SODIUM PHOSPHATE 120 MG/30ML IJ SOLN
4.0000 mg | Freq: Once | INTRAMUSCULAR | Status: AC
  Administered 2020-05-31: 4 mg via INTRA_ARTICULAR

## 2020-05-31 NOTE — Progress Notes (Signed)
She presents today for a chief complaint of pain ankles bilaterally states that she fell in [employer] in the parking lot and hurt her ankles about 2 months ago.  Objective: Vital signs are stable she is alert oriented x3.  Pulses are palpable.  She has pain on palpation posterior tibial tendon bilaterally.  No pain on palpation of the ankles.  Radiographs taken today do not demonstrate any type of osseous abnormalities of the ankles or subtalar joints.  Assessment: Posterior tibial tendinitis.  Plan: Injected the area today of dexamethasone local anesthetics of 4 mg was injected bilaterally.  Follow-up with her on an as-needed basis.

## 2020-10-06 ENCOUNTER — Other Ambulatory Visit: Payer: Self-pay

## 2020-10-06 ENCOUNTER — Ambulatory Visit (INDEPENDENT_AMBULATORY_CARE_PROVIDER_SITE_OTHER): Payer: BC Managed Care – PPO | Admitting: Podiatry

## 2020-10-06 ENCOUNTER — Encounter: Payer: Self-pay | Admitting: Podiatry

## 2020-10-06 DIAGNOSIS — M76822 Posterior tibial tendinitis, left leg: Secondary | ICD-10-CM | POA: Diagnosis not present

## 2020-10-06 DIAGNOSIS — M76821 Posterior tibial tendinitis, right leg: Secondary | ICD-10-CM

## 2020-10-06 NOTE — Progress Notes (Signed)
Patient presents today to be casted for custom molded orthotics. Dr. Milinda Pointer has been treating patient for posterior tibial tendonitis.  Impression foam cast was taken. ABN signed.  Patient info-  Shoe size: 7  Shoe style: sneakers  Height: 5'3  Weight: 227   Patient will be notified once orthotics arrive in office and reappoint for fitting at that time.

## 2020-10-26 ENCOUNTER — Telehealth: Payer: Self-pay | Admitting: Podiatry

## 2020-10-26 NOTE — Telephone Encounter (Signed)
Called pt again to make sure she is aware the orthotics are in the [place] office.

## 2020-10-26 NOTE — Telephone Encounter (Signed)
Orthotics in..lvm for pt ok to pick up or if she wants and appt to call to schedule.

## 2020-10-27 ENCOUNTER — Telehealth: Payer: Self-pay

## 2020-10-27 NOTE — Telephone Encounter (Signed)
Patient is requesting another way to pick up her orthotics. She says she is unable to pick them up during open hours. Wants to know if we can "leave them outside" or if we can mail them to he Please advise

## 2020-12-15 ENCOUNTER — Other Ambulatory Visit: Payer: Self-pay

## 2020-12-15 ENCOUNTER — Ambulatory Visit (INDEPENDENT_AMBULATORY_CARE_PROVIDER_SITE_OTHER): Payer: BC Managed Care – PPO | Admitting: Podiatry

## 2020-12-15 DIAGNOSIS — M722 Plantar fascial fibromatosis: Secondary | ICD-10-CM

## 2020-12-15 DIAGNOSIS — M76821 Posterior tibial tendinitis, right leg: Secondary | ICD-10-CM

## 2020-12-15 DIAGNOSIS — M76822 Posterior tibial tendinitis, left leg: Secondary | ICD-10-CM

## 2020-12-15 MED ORDER — METHYLPREDNISOLONE 4 MG PO TBPK: ORAL_TABLET | ORAL | 0 refills | Status: DC

## 2020-12-15 NOTE — Progress Notes (Signed)
She presents today for orthotic check states that the orthotics is not as comfortable as they have been in the past but it may be the shoes.  States that she still having some medial ankle pain and some heel pain as well.  Objective: Vital signs are stable she is alert oriented x3 there is no erythema edema cellulitis drainage or odor still has pain on palpation of posterior tibial tendon feels like a small ganglion cyst along the medial and posterior tibial tendon at the level of the tibial malleolus.  Also she has pain on palpation medial calcaneal tubercles bilateral.  Assessment: Posterior tibial tendinitis most likely compensatory in nature with Planter fasciitis.  Tibialis anterior tendon is also tender.  Plan: Injected bilateral heels today discussed appropriate shoe gear and orthotic use with her once again.  I injected the plantar fascia and started her on a Medrol Dosepak.  Follow-up with her in 1 month at which time we may inject the posterior tibial tendons.

## 2020-12-16 DIAGNOSIS — M722 Plantar fascial fibromatosis: Secondary | ICD-10-CM | POA: Diagnosis not present

## 2020-12-16 MED ORDER — TRIAMCINOLONE ACETONIDE 40 MG/ML IJ SUSP
40.0000 mg | Freq: Once | INTRAMUSCULAR | Status: AC
  Administered 2020-12-16: 40 mg

## 2020-12-16 NOTE — Addendum Note (Signed)
Addended by: Rip Harbour on: 12/16/2020 08:25 AM   Modules accepted: Orders

## 2021-04-06 ENCOUNTER — Other Ambulatory Visit: Payer: Self-pay

## 2021-04-06 ENCOUNTER — Ambulatory Visit (INDEPENDENT_AMBULATORY_CARE_PROVIDER_SITE_OTHER): Payer: Self-pay | Admitting: Podiatry

## 2021-04-06 DIAGNOSIS — M722 Plantar fascial fibromatosis: Secondary | ICD-10-CM

## 2021-04-06 NOTE — Progress Notes (Signed)
She presents today chief complaint of painful heels though they are much better than they were previously prior to her injections she is also complaining of pain around the medial aspect of the ankles bilaterally.  States the left foot may be a little bit worse than the right.  Objective: Vital signs are stable alert oriented x3 pulses are palpable she has pain on palpation posterior tibial tendon with some fluctuance just to the inferior medial malleolar region.  But the tendon itself appears to be intact and does demonstrate good strength.  She still has some tenderness on palpation medial calcaneal tubercles bilateral.  Assessment: Planter fasciitis with compensatory posterior tibial tendinitis bilateral.  Plan: I injected the plantar fascial again today 20 mg Kenalog 5 mg Marcaine for maximal tenderness.  Injected point maximal tenderness making sure not to inject into the tendon with 2 mg of dexamethasone of the posterior tibial tendon bilaterally.  She tolerated procedure well without complications we will notify us with any questions or concerns.

## 2021-07-11 ENCOUNTER — Telehealth: Payer: Self-pay | Admitting: Podiatry

## 2021-07-11 ENCOUNTER — Encounter: Payer: Self-pay | Admitting: Podiatry

## 2021-07-11 NOTE — Telephone Encounter (Signed)
Patient called she is having a extreme amount of pain in R foot -  I got her scheduled for 08/08/21 3:45pm, she would like to speak with Dr Milinda Pointer or his nurse. She said that she is not able to drive to [place],  and she needs to know what to do for her foot before it gets to the point of not being able to walk.

## 2021-07-13 ENCOUNTER — Ambulatory Visit: Payer: BC Managed Care – PPO | Admitting: Podiatry

## 2021-07-13 ENCOUNTER — Other Ambulatory Visit: Payer: Self-pay

## 2021-07-13 ENCOUNTER — Encounter: Payer: Self-pay | Admitting: Podiatry

## 2021-07-13 DIAGNOSIS — M722 Plantar fascial fibromatosis: Secondary | ICD-10-CM

## 2021-07-13 DIAGNOSIS — M76821 Posterior tibial tendinitis, right leg: Secondary | ICD-10-CM

## 2021-07-13 DIAGNOSIS — M778 Other enthesopathies, not elsewhere classified: Secondary | ICD-10-CM

## 2021-07-13 DIAGNOSIS — I872 Venous insufficiency (chronic) (peripheral): Secondary | ICD-10-CM

## 2021-07-13 DIAGNOSIS — M76822 Posterior tibial tendinitis, left leg: Secondary | ICD-10-CM

## 2021-07-13 MED ORDER — CELECOXIB 200 MG PO CAPS: 200.0000 mg | ORAL_CAPSULE | Freq: Two times a day (BID) | ORAL | 3 refills | Status: DC

## 2021-07-13 MED ORDER — METHYLPREDNISOLONE 4 MG PO TBPK: ORAL_TABLET | ORAL | 0 refills | Status: DC

## 2021-07-13 NOTE — Progress Notes (Signed)
She presents today for follow-up of her Planter fasciitis and posterior tibial tendinitis bilaterally.  States that my feet are hurting so bad over the past 3 to 4 days I could even walk.  She states that it started hurting from the knee down and I have all the swelling now she is does state that she had been placed on metformin for a while and discontinued that she states that she has been placed on her metoprolol recently and those are the only changes.  She states that her feet just feel like to go to blow off her legs which is different than the previous pain that she was feeling.  Objective: Vital signs are stable she is alert and oriented x3.  She has pitting edema to bilateral lower extremity pulses are strongly palpable.  She has some tenderness along the posterior tibial tendon in the plantar fascia but no more than it has been previously.  She does have swelling throughout.  Assessment: Cannot rule out the swelling to physiologic disorder or a venous insufficiency.  's plan: At this point we will start her on a Medrol Dosepak to be followed by mobic Celebrex.  I am also going to request a venous insufficiency evaluation.

## 2021-07-14 ENCOUNTER — Ambulatory Visit (HOSPITAL_COMMUNITY)
Admission: RE | Admit: 2021-07-14 | Discharge: 2021-07-14 | Disposition: A | Discharge status: Home / Self Care | Payer: BC Managed Care – PPO | Source: Ambulatory Visit | Attending: Podiatry | Admitting: Podiatry

## 2021-07-14 DIAGNOSIS — I872 Venous insufficiency (chronic) (peripheral): Secondary | ICD-10-CM | POA: Diagnosis present

## 2021-07-17 ENCOUNTER — Encounter: Payer: Self-pay | Admitting: Podiatry

## 2021-07-26 ENCOUNTER — Other Ambulatory Visit: Payer: Self-pay | Admitting: Podiatry

## 2021-07-26 ENCOUNTER — Encounter: Payer: Self-pay | Admitting: Podiatry

## 2021-07-26 DIAGNOSIS — I872 Venous insufficiency (chronic) (peripheral): Secondary | ICD-10-CM

## 2021-08-01 ENCOUNTER — Other Ambulatory Visit: Payer: Self-pay | Admitting: *Deleted

## 2021-08-01 ENCOUNTER — Telehealth: Payer: Self-pay | Admitting: *Deleted

## 2021-08-01 DIAGNOSIS — M5136 Other intervertebral disc degeneration, lumbar region: Secondary | ICD-10-CM

## 2021-08-01 NOTE — Telephone Encounter (Signed)
Send referral to [facility] for chronic back pain.

## 2021-08-01 NOTE — Telephone Encounter (Signed)
Faxed referral to [facility], received confirmation 08/01/21 per patient's request.

## 2021-08-08 ENCOUNTER — Ambulatory Visit: Payer: Self-pay | Admitting: Podiatry

## 2021-08-10 ENCOUNTER — Other Ambulatory Visit: Payer: Self-pay

## 2021-08-10 ENCOUNTER — Ambulatory Visit: Payer: BC Managed Care – PPO | Admitting: Podiatry

## 2021-08-10 ENCOUNTER — Encounter: Payer: Self-pay | Admitting: Podiatry

## 2021-08-10 ENCOUNTER — Telehealth: Payer: Self-pay

## 2021-08-10 DIAGNOSIS — M76822 Posterior tibial tendinitis, left leg: Secondary | ICD-10-CM

## 2021-08-10 DIAGNOSIS — M76821 Posterior tibial tendinitis, right leg: Secondary | ICD-10-CM | POA: Diagnosis not present

## 2021-08-10 MED ORDER — TRIAMCINOLONE ACETONIDE 40 MG/ML IJ SUSP
40.0000 mg | Freq: Once | INTRAMUSCULAR | Status: AC
  Administered 2021-08-10: 40 mg

## 2021-08-10 NOTE — Telephone Encounter (Signed)
Second order submitted per Dr. Milinda Pointer

## 2021-08-12 NOTE — Progress Notes (Signed)
She presents today for chief complaint of pain in her feet.  She states that the vascular confirmed her venous insufficiency as we had suspected and instructed her to wear compression socks but she has not been aware though she does not like the way they feel she states that her ankles chronically feel weak all of the time.  Objective: Vital signs are stable she is alert and oriented x3.  She has pain to palpation of posterior tibial tendon with fluctuance in the tendon sheath.  Assessment posterior tibial tendinitis venous insufficiency  Plan: She lost her previous orthotics were going to get her a new pair of orthotics.  And I injected the area today with Kenalog and local anesthetic 5 mg was injected into the tendon sheath bilateral posterior tibial tendon area.  Should she have questions or concerns she will notify us immediately we did discuss possible consequences and complications associated with injecting anti-inflammatory into this area she understands and is amenable to it.

## 2021-08-29 ENCOUNTER — Encounter: Payer: BC Managed Care – PPO | Admitting: Vascular Surgery

## 2021-09-04 ENCOUNTER — Ambulatory Visit: Payer: BC Managed Care – PPO

## 2021-09-04 ENCOUNTER — Other Ambulatory Visit: Payer: Self-pay

## 2021-09-04 DIAGNOSIS — M76821 Posterior tibial tendinitis, right leg: Secondary | ICD-10-CM

## 2021-09-04 DIAGNOSIS — M722 Plantar fascial fibromatosis: Secondary | ICD-10-CM

## 2021-09-04 NOTE — Progress Notes (Signed)
SITUATION: ?Reason for Visit: Fitting and Delivery of Custom Fabricated Foot Orthoses ?Patient Report: Patient reports comfort and is satisfied with device. ? ?OBJECTIVE DATA: ?Patient History / Diagnosis:   ?  ICD-10-CM   ?1. Posterior tibialis tendinitis of both lower extremities  M76.821   ? O37.858   ?  ?2. Plantar fasciitis  M72.2   ?  ? ? ?Provided Device:  Custom Functional Foot Orthotics ?    RicheyLAB: [id number] ? ?GOAL OF ORTHOSIS ?- Improve gait ?- Decrease energy expenditure ?- Improve Balance ?- Provide Triplanar stability of foot complex ?- Facilitate motion ? ?ACTIONS PERFORMED ?Patient was fit with foot orthotics trimmed to shoe last. Patient tolerated fittign procedure.  ? ?Patient was provided with verbal and written instruction and demonstration regarding donning, doffing, wear, care, proper fit, function, purpose, cleaning, and use of the orthosis and in all related precautions and risks and benefits regarding the orthosis. ? ?Patient was also provided with verbal instruction regarding how to report any failures or malfunctions of the orthosis and necessary follow up care. Patient was also instructed to contact our office regarding any change in status that may affect the function of the orthosis. ? ?Patient demonstrated independence with proper donning, doffing, and fit and verbalized understanding of all instructions. ? ?PLAN: ?Patient is to follow up in one week or as necessary (PRN). All questions were answered and concerns addressed. Plan of care was discussed with and agreed upon by the patient. ? ?

## 2021-10-03 ENCOUNTER — Encounter: Payer: BC Managed Care – PPO | Admitting: Vascular Surgery

## 2021-11-22 ENCOUNTER — Other Ambulatory Visit: Payer: Self-pay | Admitting: Podiatry

## 2022-04-03 ENCOUNTER — Other Ambulatory Visit: Payer: Self-pay | Admitting: Podiatry

## 2022-05-02 ENCOUNTER — Encounter: Payer: Self-pay | Admitting: Podiatry

## 2022-05-08 ENCOUNTER — Ambulatory Visit (INDEPENDENT_AMBULATORY_CARE_PROVIDER_SITE_OTHER): Payer: BC Managed Care – PPO | Admitting: Podiatry

## 2022-05-08 DIAGNOSIS — M722 Plantar fascial fibromatosis: Secondary | ICD-10-CM

## 2022-05-08 DIAGNOSIS — M7751 Other enthesopathy of right foot: Secondary | ICD-10-CM | POA: Diagnosis not present

## 2022-05-08 DIAGNOSIS — M7752 Other enthesopathy of left foot: Secondary | ICD-10-CM

## 2022-05-08 MED ORDER — TRIAMCINOLONE ACETONIDE 40 MG/ML IJ SUSP
80.0000 mg | Freq: Once | INTRAMUSCULAR | Status: AC
  Administered 2022-05-08: 80 mg

## 2022-05-13 NOTE — Progress Notes (Signed)
She presents today chief complaint of painful ankles as she points to the sinus tarsi area bilaterally she states that her heels are starting to hurt.  Objective: Vital signs are stable alert oriented x 3.  There is no erythema edema cellulitis drainage or odor she has pain on end range of motion of the subtalar joint bilateral as well as on direct palpation of the sinus tarsi bilateral.  She is also having pain on palpation medial calcaneal tubercles and some pain on the lateral calcaneal tubercle and fourth fifth tarsometatarsal joint area.  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  She is also having sinus tarsitis with subtalar joint capsulitis.  Plan: Discussed etiology pathology conservative surgical therapies at this point today injected 10 mg Kenalog 5 mg Marcaine point maximal tenderness plantar fascial bilaterally.  Also to the subtalar joint and sinus tarsi bilateral.  Tolerated procedure well follow-up with me on an as-needed basis.

## 2023-02-08 ENCOUNTER — Encounter: Payer: Self-pay | Admitting: Podiatry

## 2023-02-12 ENCOUNTER — Encounter: Payer: Self-pay | Admitting: Podiatry

## 2023-02-13 ENCOUNTER — Ambulatory Visit: Payer: BC Managed Care – PPO | Admitting: Podiatry

## 2023-02-13 ENCOUNTER — Ambulatory Visit: Payer: 59 | Admitting: Podiatry

## 2023-02-13 DIAGNOSIS — M7751 Other enthesopathy of right foot: Secondary | ICD-10-CM | POA: Diagnosis not present

## 2023-02-13 DIAGNOSIS — M7752 Other enthesopathy of left foot: Secondary | ICD-10-CM | POA: Diagnosis not present

## 2023-02-13 MED ORDER — TRIAMCINOLONE ACETONIDE 40 MG/ML IJ SUSP
40.0000 mg | Freq: Once | INTRAMUSCULAR | Status: AC
Start: 2023-02-13 — End: 2023-02-13
  Administered 2023-02-13: 40 mg

## 2023-02-14 ENCOUNTER — Ambulatory Visit: Payer: BC Managed Care – PPO | Admitting: Podiatry

## 2023-02-14 NOTE — Progress Notes (Signed)
She presents today of painful ankles anteriorly.  States that she can hardly walk at times and feels that she is just going to collapse when they grab.  Objective: Vital signs stable alert and oriented x 3.  Pulses are palpable.  She has pain on palpation to the medial gutter on the right foot and anterior ankle joint on the left foot.  Assessment: Ankle joint capsulitis bilateral.  Plan: I injected these areas today with Kenalog and local anesthetic point of maximal tenderness.  After sterile Betadine skin prep.  Tolerated procedure well follow-up with her as needed

## 2024-01-02 ENCOUNTER — Emergency Department

## 2024-01-02 ENCOUNTER — Other Ambulatory Visit: Payer: Self-pay

## 2024-01-02 ENCOUNTER — Emergency Department
Admission: EM | Admit: 2024-01-02 | Discharge: 2024-01-02 | Disposition: A | Discharge status: Home / Self Care | Attending: Emergency Medicine | Admitting: Emergency Medicine

## 2024-01-02 ENCOUNTER — Encounter: Payer: Self-pay | Admitting: Emergency Medicine

## 2024-01-02 DIAGNOSIS — J45909 Unspecified asthma, uncomplicated: Secondary | ICD-10-CM | POA: Insufficient documentation

## 2024-01-02 DIAGNOSIS — Z85528 Personal history of other malignant neoplasm of kidney: Secondary | ICD-10-CM | POA: Insufficient documentation

## 2024-01-02 DIAGNOSIS — F0781 Postconcussional syndrome: Secondary | ICD-10-CM | POA: Insufficient documentation

## 2024-01-02 DIAGNOSIS — R519 Headache, unspecified: Secondary | ICD-10-CM | POA: Diagnosis present

## 2024-01-02 DIAGNOSIS — G44309 Post-traumatic headache, unspecified, not intractable: Secondary | ICD-10-CM | POA: Diagnosis not present

## 2024-01-02 LAB — CBC WITH DIFFERENTIAL/PLATELET
Abs Immature Granulocytes: 0.01 K/uL (ref 0.00–0.07)
Basophils Absolute: 0.1 K/uL (ref 0.0–0.1)
Basophils Relative: 1 %
Eosinophils Absolute: 0.1 K/uL (ref 0.0–0.5)
Eosinophils Relative: 2 %
HCT: 41.9 % (ref 36.0–46.0)
Hemoglobin: 13.6 g/dL (ref 12.0–15.0)
Immature Granulocytes: 0 %
Lymphocytes Relative: 30 %
Lymphs Abs: 1.9 K/uL (ref 0.7–4.0)
MCH: 29.3 pg (ref 26.0–34.0)
MCHC: 32.5 g/dL (ref 30.0–36.0)
MCV: 90.3 fL (ref 80.0–100.0)
Monocytes Absolute: 0.6 K/uL (ref 0.1–1.0)
Monocytes Relative: 9 %
Neutro Abs: 3.6 K/uL (ref 1.7–7.7)
Neutrophils Relative %: 58 %
Platelets: 293 K/uL (ref 150–400)
RBC: 4.64 MIL/uL (ref 3.87–5.11)
RDW: 12.8 % (ref 11.5–15.5)
WBC: 6.1 K/uL (ref 4.0–10.5)
nRBC: 0 % (ref 0.0–0.2)

## 2024-01-02 LAB — BASIC METABOLIC PANEL WITH GFR
Anion gap: 8 (ref 5–15)
BUN: 13 mg/dL (ref 6–20)
CO2: 27 mmol/L (ref 22–32)
Calcium: 9.4 mg/dL (ref 8.9–10.3)
Chloride: 104 mmol/L (ref 98–111)
Creatinine, Ser: 0.81 mg/dL (ref 0.44–1.00)
GFR, Estimated: >60 mL/min (ref >60)
Glucose, Bld: 93 mg/dL (ref 70–99)
Potassium: 4 mmol/L (ref 3.5–5.1)
Sodium: 139 mmol/L (ref 135–145)

## 2024-01-02 MED ORDER — METOCLOPRAMIDE HCL 10 MG PO TABS: 10.0000 mg | ORAL_TABLET | Freq: Three times a day (TID) | ORAL | 1 refills | Status: AC | PRN

## 2024-01-02 MED ORDER — DIPHENHYDRAMINE HCL 25 MG PO CAPS
25.0000 mg | ORAL_CAPSULE | Freq: Once | ORAL | Status: AC
  Administered 2024-01-02: 25 mg via ORAL
  Filled 2024-01-02: qty 1

## 2024-01-02 MED ORDER — METOCLOPRAMIDE HCL 10 MG PO TABS
10.0000 mg | ORAL_TABLET | Freq: Once | ORAL | Status: AC
  Administered 2024-01-02: 10 mg via ORAL
  Filled 2024-01-02: qty 1

## 2024-01-02 MED ORDER — BUTALBITAL-APAP-CAFFEINE 50-325-40 MG PO TABS: 1.0000 | ORAL_TABLET | Freq: Four times a day (QID) | ORAL | 0 refills | Status: AC | PRN

## 2024-01-02 MED ORDER — ACETAMINOPHEN 500 MG PO TABS
1000.0000 mg | ORAL_TABLET | Freq: Once | ORAL | Status: AC
  Administered 2024-01-02: 1000 mg via ORAL
  Filled 2024-01-02: qty 2

## 2024-01-02 MED ORDER — METHOCARBAMOL 500 MG PO TABS: 1000.0000 mg | ORAL_TABLET | Freq: Three times a day (TID) | ORAL | 0 refills | Status: AC
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
## 2024-01-02 NOTE — Discharge Instructions (Addendum)
 The CT of your head and neck did not show any acute abnormalities. I believe your headache is the result of a concussion. Please schedule a follow up appointment with neurology, information is attached.   I have sent a medication for treatment of your headaches called Fioricet. This medication contains tylenol  so do not take a tylenol  at the same time as this medication.  I have sent a muscle relaxer to your pharmacy. This can be taken every 8 hours as needed for muscle spasms. This medication is sedating, so do not drive for 8 hours after taking it.   Results of the CT scan are below: IMPRESSION:  1. No acute intracranial pathology.  2. No fracture or static subluxation of the cervical spine.  3. Mild-to-moderate multilevel cervical disc degenerative disease,  worst from C4-C6.
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
## 2024-01-13 ENCOUNTER — Telehealth: Payer: Self-pay | Admitting: Neurosurgery

## 2024-01-13 NOTE — Telephone Encounter (Signed)
 Patient was seen in the ER on 01/02/2024 for a concussion but is concerned for a CSF leak. She states that since then the left side of her nose has been dripping and she can also feel this down her throat. She also states that her head feels heavy, neck pain, a bad headache, and is having memory issues. She is very concerned and would like to know what she needs to do.

## 2024-01-13 NOTE — Telephone Encounter (Signed)
 Spoke with patient and advised to go to ER or follow up with PCP. Patient was advised at her last ER visit to see neurologist, advised patient that we are neurosurgery, patient got confused, I gave patient number for neurologist office Dr Maree

## 2024-01-16 ENCOUNTER — Other Ambulatory Visit: Payer: Self-pay | Admitting: Physician Assistant

## 2024-01-16 DIAGNOSIS — R200 Anesthesia of skin: Secondary | ICD-10-CM

## 2024-01-16 DIAGNOSIS — H938X2 Other specified disorders of left ear: Secondary | ICD-10-CM

## 2024-01-16 DIAGNOSIS — R519 Headache, unspecified: Secondary | ICD-10-CM

## 2024-01-16 DIAGNOSIS — J3489 Other specified disorders of nose and nasal sinuses: Secondary | ICD-10-CM

## 2024-01-16 DIAGNOSIS — H539 Unspecified visual disturbance: Secondary | ICD-10-CM

## 2024-01-16 DIAGNOSIS — R413 Other amnesia: Secondary | ICD-10-CM

## 2024-01-16 DIAGNOSIS — Z87828 Personal history of other (healed) physical injury and trauma: Secondary | ICD-10-CM

## 2024-01-16 DIAGNOSIS — R42 Dizziness and giddiness: Secondary | ICD-10-CM

## 2024-01-16 DIAGNOSIS — F0781 Postconcussional syndrome: Secondary | ICD-10-CM

## 2024-01-16 DIAGNOSIS — M542 Cervicalgia: Secondary | ICD-10-CM

## 2024-01-23 ENCOUNTER — Encounter: Payer: Self-pay | Admitting: Physician Assistant

## 2024-02-05 ENCOUNTER — Ambulatory Visit
Admission: RE | Admit: 2024-02-05 | Discharge: 2024-02-05 | Disposition: A | Discharge status: Home / Self Care | Source: Ambulatory Visit | Attending: Physician Assistant | Admitting: Physician Assistant

## 2024-02-05 DIAGNOSIS — H539 Unspecified visual disturbance: Secondary | ICD-10-CM

## 2024-02-05 DIAGNOSIS — R42 Dizziness and giddiness: Secondary | ICD-10-CM

## 2024-02-05 DIAGNOSIS — F0781 Postconcussional syndrome: Secondary | ICD-10-CM

## 2024-02-05 DIAGNOSIS — H938X2 Other specified disorders of left ear: Secondary | ICD-10-CM

## 2024-02-05 DIAGNOSIS — Z87828 Personal history of other (healed) physical injury and trauma: Secondary | ICD-10-CM

## 2024-02-05 DIAGNOSIS — R413 Other amnesia: Secondary | ICD-10-CM

## 2024-02-05 DIAGNOSIS — R519 Headache, unspecified: Secondary | ICD-10-CM

## 2024-02-05 DIAGNOSIS — R200 Anesthesia of skin: Secondary | ICD-10-CM

## 2024-02-05 DIAGNOSIS — J3489 Other specified disorders of nose and nasal sinuses: Secondary | ICD-10-CM

## 2024-02-05 DIAGNOSIS — M542 Cervicalgia: Secondary | ICD-10-CM

## 2024-02-05 MED ORDER — GADOPICLENOL 0.5 MMOL/ML IV SOLN
7.5000 mL | Freq: Once | INTRAVENOUS | Status: AC | PRN
  Administered 2024-02-05: 7.5 mL via INTRAVENOUS

## 2024-04-08 ENCOUNTER — Other Ambulatory Visit: Payer: Self-pay | Admitting: Urology

## 2024-04-08 DIAGNOSIS — C642 Malignant neoplasm of left kidney, except renal pelvis: Secondary | ICD-10-CM

## 2024-04-10 ENCOUNTER — Other Ambulatory Visit

## 2024-04-15 ENCOUNTER — Ambulatory Visit
Admission: RE | Admit: 2024-04-15 | Discharge: 2024-04-15 | Disposition: A | Discharge status: Home / Self Care | Source: Ambulatory Visit | Attending: Urology | Admitting: Urology

## 2024-04-15 DIAGNOSIS — C642 Malignant neoplasm of left kidney, except renal pelvis: Secondary | ICD-10-CM

## 2024-04-15 MED ORDER — IOPAMIDOL (ISOVUE-300) INJECTION 61%
100.0000 mL | Freq: Once | INTRAVENOUS | Status: AC | PRN
  Administered 2024-04-15: 100 mL via INTRAVENOUS

## 2024-07-23 ENCOUNTER — Ambulatory Visit
